# Patient Record
Sex: Male | Born: 1945
Health system: Southern US, Community
[De-identification: ages and names within clinical notes are randomized; demographics above are authoritative.]

---

## 2007-11-03 ENCOUNTER — Ambulatory Visit (HOSPITAL_COMMUNITY): Admission: RE | Admit: 2007-11-03 | Discharge: 2007-11-04 | Payer: Self-pay | Admitting: Otolaryngology

## 2009-09-27 IMAGING — CR DG CHEST 2V
2 series · 2 of 2 positions shown · non-contrast
Comparison: None

CLINICAL DATA: Preop retinal detachment

CHEST - 2 VIEW

[view not recorded (1 of 2)]
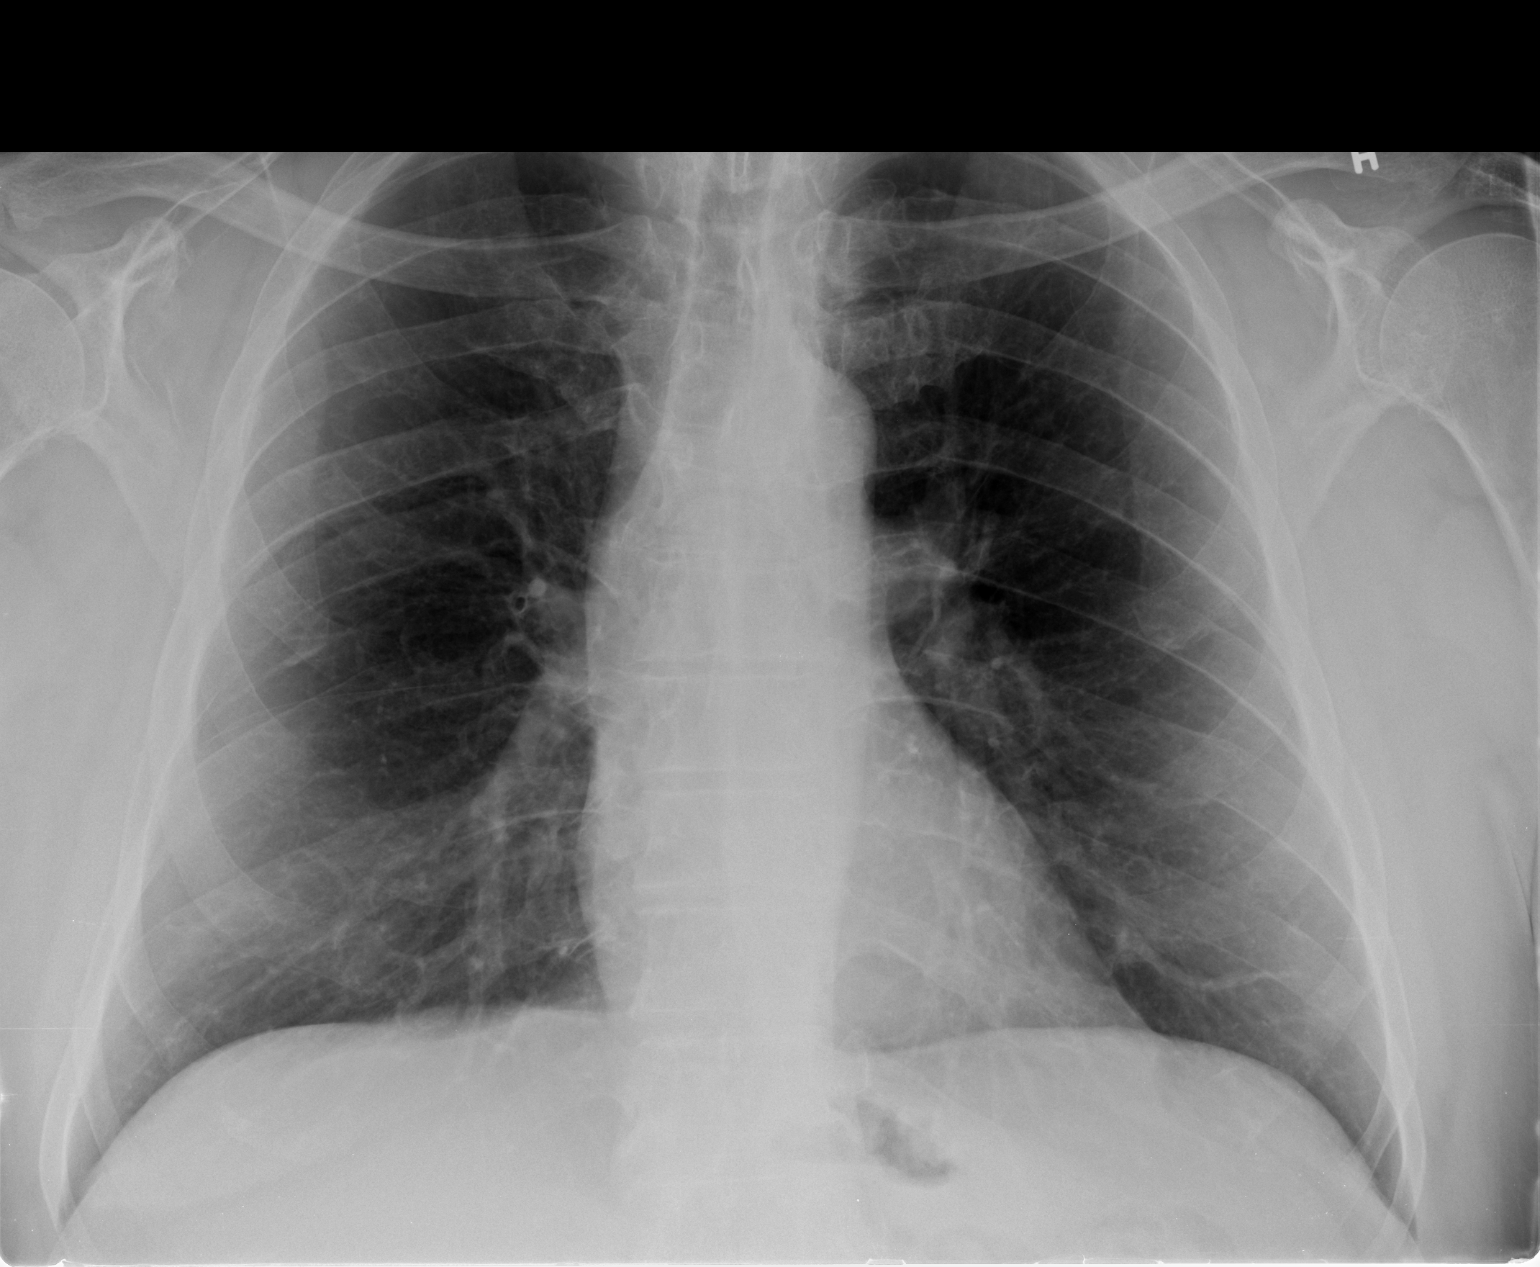

[view not recorded (2 of 2)]
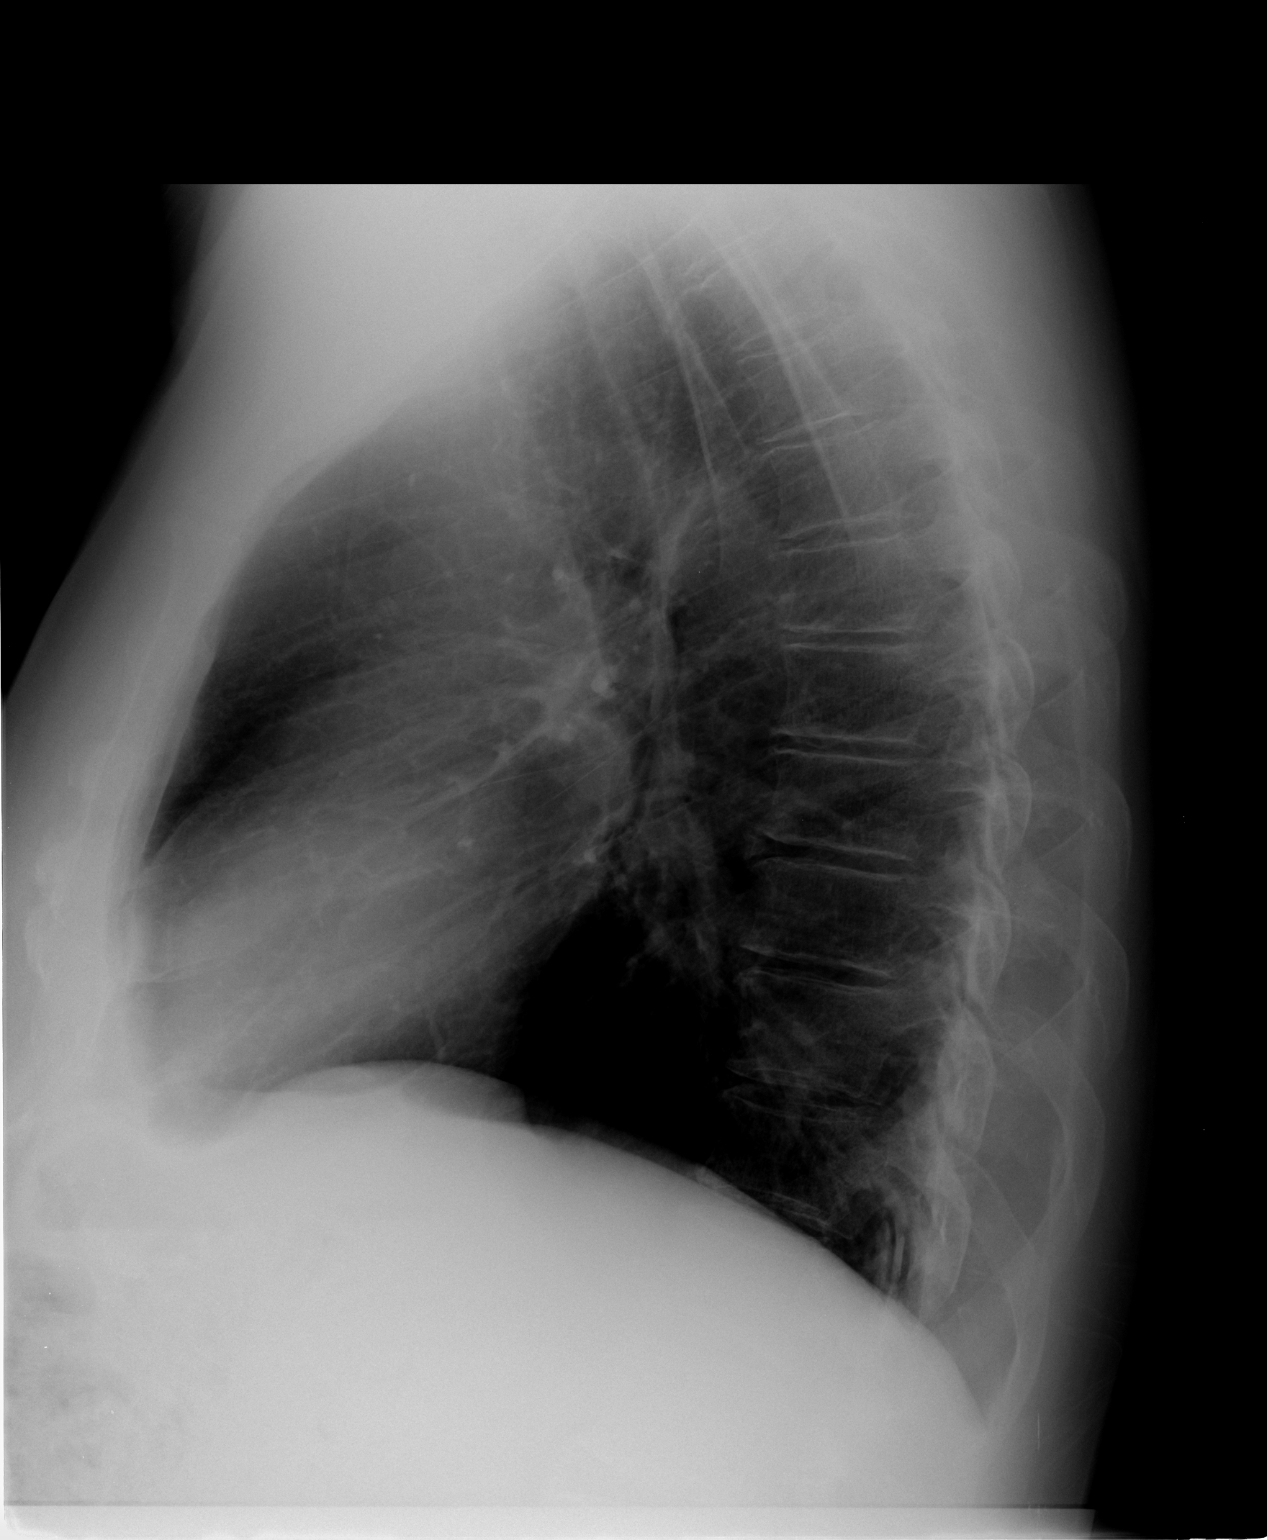

[2 of 2 positions shown; findings below may reference images not displayed]

FINDINGS: Normal mediastinum and cardiac silhouette.  Costophrenic
angles are clear.  No evidence effusion, infiltrate, or
pneumothorax.
IMPRESSION: No acute cardiopulmonary process.

## 2010-12-04 NOTE — Op Note (Signed)
NAMEROBB, SIBAL NO.:  [id number]   MEDICAL RECORD NO.:  [id number]          PATIENT TYPE:  OIB   LOCATION:  5125                         FACILITY:  [facility]   PHYSICIAN:  Beulah Gandy. Ashley Royalty, M.D. DATE OF BIRTH:  Feb 26, 1946   DATE OF PROCEDURE:  11/03/2007  DATE OF DISCHARGE:                               OPERATIVE REPORT   ADMISSION DIAGNOSES:  1. Retinal break with detachment, right eye.  2. Retinal break with a large retinal detachment, left eye.   PROCEDURES:  1. Retinal photocoagulation for retinal break, right eye.  2. Scleral buckle, retinal photocoagulation, gas fluid exchange in the      left eye.   SURGEON:  Beulah Gandy. Ashley Royalty, M.D.   ASSISTANT:  Rosalie Doctor, M.A.   ANESTHESIA:  General.   PROCEDURE IN DETAIL:  After proper endotracheal anesthesia, attention  was carried to the right eye where the indirect ophthalmoscope laser was  moved into place, 1089 burns were placed around the superior area of  retinal detachment and breaks.  The power was 400 mW and it was 0.1  seconds each and the spot size was 1000 microns.  Ointment was placed in  the eye and it was patched, shut.  Attention was then carried to the  left eye, where the usual prep and drape was performed.  A 360-degree  limbal peritomy isolation of 4 rectus muscles on 2-0 silk localization  of break at 2 o'clock.  Scleral dissection for 360 degrees to admit  number 279 intrascleral implant.  Diathermy placed in the bed.  An  additional radial thyroid G segment was placed at 2 o'clock beneath the  break.  Perforation site was chosen at 10 o'clock.  Two sutures per  quadrant for a total of eight 4-0 Mersilene sutures were placed in the  scleral flaps.  Perforation was performed without complication.  A large  amount of clear, colorless, thick, sticky fluid came forth.  The scleral  sutures were drawn securely as the fluid egressed.  Additional volume  was required and 50% C3F8 1.8 mL was  injected into the vitreous cavity  to reinflate the globe.  The buckle was adjusted and trimmed.  The band  was adjusted and trimmed.  The sutures were knotted and the free ends  removed.  Indirect ophthalmoscopy showed the retina to be lying nicely  in place with a break well supported.  The indirect ophthalmoscope laser  was moved into place, 529 burns were placed around the retinal break in  the area of the previous detachment.  The power was 1100 mW, 1000  microns each and 0.15 seconds each.  The conjunctiva was reapproximated  with 7-0 chromic suture.  Polymyxin and gentamicin were irrigated at the  [place] space.  Atropine solution was applied.  Marcaine was injected  around the globe for postop pain.  Decadron 10 mg was injected to the  lower subconjunctival space.  A paracentesis x1 obtained to closing  pressure of 15 with a bare keratometer.  TobraDex ophthalmic ointment, a  patch and shield were placed.  The patient was awaken and taken to  recovery in satisfactory condition.      Beulah Gandy. Ashley Royalty, M.D.  Electronically Signed     JDM/MEDQ  D:  11/03/2007  T:  11/04/2007  Job:  [id number]

## 2011-03-08 ENCOUNTER — Encounter (INDEPENDENT_AMBULATORY_CARE_PROVIDER_SITE_OTHER): Payer: Managed Care, Other (non HMO) | Admitting: Ophthalmology

## 2011-03-08 DIAGNOSIS — H33309 Unspecified retinal break, unspecified eye: Secondary | ICD-10-CM

## 2011-03-08 DIAGNOSIS — H43819 Vitreous degeneration, unspecified eye: Secondary | ICD-10-CM

## 2011-03-08 DIAGNOSIS — H33009 Unspecified retinal detachment with retinal break, unspecified eye: Secondary | ICD-10-CM

## 2011-03-08 DIAGNOSIS — E11319 Type 2 diabetes mellitus with unspecified diabetic retinopathy without macular edema: Secondary | ICD-10-CM

## 2011-04-16 LAB — BASIC METABOLIC PANEL
BUN: 15
CO2: 29
Calcium: 9.4
Creatinine, Ser: 0.98
GFR calc non Af Amer: >60
Glucose, Bld: 146 — ABNORMAL HIGH

## 2011-04-16 LAB — CBC
Hemoglobin: 16.2
MCHC: 34.1
Platelets: 256
RDW: 13.8

## 2012-03-04 ENCOUNTER — Encounter (INDEPENDENT_AMBULATORY_CARE_PROVIDER_SITE_OTHER): Payer: Medicare Other | Admitting: Ophthalmology

## 2012-03-04 DIAGNOSIS — H33309 Unspecified retinal break, unspecified eye: Secondary | ICD-10-CM

## 2012-03-04 DIAGNOSIS — E1139 Type 2 diabetes mellitus with other diabetic ophthalmic complication: Secondary | ICD-10-CM

## 2012-03-04 DIAGNOSIS — E11329 Type 2 diabetes mellitus with mild nonproliferative diabetic retinopathy without macular edema: Secondary | ICD-10-CM

## 2012-03-04 DIAGNOSIS — H26499 Other secondary cataract, unspecified eye: Secondary | ICD-10-CM

## 2012-03-04 DIAGNOSIS — H33009 Unspecified retinal detachment with retinal break, unspecified eye: Secondary | ICD-10-CM

## 2012-03-09 ENCOUNTER — Encounter (INDEPENDENT_AMBULATORY_CARE_PROVIDER_SITE_OTHER): Payer: Managed Care, Other (non HMO) | Admitting: Ophthalmology

## 2012-03-18 ENCOUNTER — Ambulatory Visit (INDEPENDENT_AMBULATORY_CARE_PROVIDER_SITE_OTHER): Payer: Medicare Other | Admitting: Ophthalmology

## 2012-03-18 DIAGNOSIS — H26499 Other secondary cataract, unspecified eye: Secondary | ICD-10-CM

## 2012-03-25 ENCOUNTER — Ambulatory Visit (INDEPENDENT_AMBULATORY_CARE_PROVIDER_SITE_OTHER): Payer: Medicare Other | Admitting: Ophthalmology

## 2012-03-25 DIAGNOSIS — H2700 Aphakia, unspecified eye: Secondary | ICD-10-CM

## 2013-03-29 ENCOUNTER — Ambulatory Visit (INDEPENDENT_AMBULATORY_CARE_PROVIDER_SITE_OTHER): Payer: Medicare Other | Admitting: Ophthalmology

## 2013-03-30 ENCOUNTER — Ambulatory Visit (INDEPENDENT_AMBULATORY_CARE_PROVIDER_SITE_OTHER): Payer: Medicare HMO | Admitting: Ophthalmology

## 2013-03-30 DIAGNOSIS — E11319 Type 2 diabetes mellitus with unspecified diabetic retinopathy without macular edema: Secondary | ICD-10-CM

## 2013-03-30 DIAGNOSIS — H33309 Unspecified retinal break, unspecified eye: Secondary | ICD-10-CM

## 2013-03-30 DIAGNOSIS — H43819 Vitreous degeneration, unspecified eye: Secondary | ICD-10-CM

## 2013-03-30 DIAGNOSIS — H35039 Hypertensive retinopathy, unspecified eye: Secondary | ICD-10-CM

## 2013-03-30 DIAGNOSIS — I10 Essential (primary) hypertension: Secondary | ICD-10-CM

## 2013-03-30 DIAGNOSIS — H33009 Unspecified retinal detachment with retinal break, unspecified eye: Secondary | ICD-10-CM

## 2013-03-30 DIAGNOSIS — E1139 Type 2 diabetes mellitus with other diabetic ophthalmic complication: Secondary | ICD-10-CM

## 2013-12-29 DIAGNOSIS — R351 Nocturia: Secondary | ICD-10-CM | POA: Insufficient documentation

## 2013-12-29 DIAGNOSIS — I10 Essential (primary) hypertension: Secondary | ICD-10-CM | POA: Insufficient documentation

## 2013-12-29 DIAGNOSIS — E119 Type 2 diabetes mellitus without complications: Secondary | ICD-10-CM | POA: Insufficient documentation

## 2014-03-30 ENCOUNTER — Ambulatory Visit (INDEPENDENT_AMBULATORY_CARE_PROVIDER_SITE_OTHER): Payer: Medicare HMO | Admitting: Ophthalmology

## 2014-04-01 ENCOUNTER — Ambulatory Visit (INDEPENDENT_AMBULATORY_CARE_PROVIDER_SITE_OTHER): Payer: Medicare HMO | Admitting: Ophthalmology

## 2014-04-01 ENCOUNTER — Ambulatory Visit (INDEPENDENT_AMBULATORY_CARE_PROVIDER_SITE_OTHER): Payer: Commercial Managed Care - HMO | Admitting: Ophthalmology

## 2014-04-01 DIAGNOSIS — H43819 Vitreous degeneration, unspecified eye: Secondary | ICD-10-CM

## 2014-04-01 DIAGNOSIS — H35039 Hypertensive retinopathy, unspecified eye: Secondary | ICD-10-CM

## 2014-04-01 DIAGNOSIS — H33309 Unspecified retinal break, unspecified eye: Secondary | ICD-10-CM

## 2014-04-01 DIAGNOSIS — E11319 Type 2 diabetes mellitus with unspecified diabetic retinopathy without macular edema: Secondary | ICD-10-CM

## 2014-04-01 DIAGNOSIS — H33009 Unspecified retinal detachment with retinal break, unspecified eye: Secondary | ICD-10-CM

## 2014-04-01 DIAGNOSIS — E1139 Type 2 diabetes mellitus with other diabetic ophthalmic complication: Secondary | ICD-10-CM

## 2014-04-01 DIAGNOSIS — I10 Essential (primary) hypertension: Secondary | ICD-10-CM

## 2014-04-01 DIAGNOSIS — E1165 Type 2 diabetes mellitus with hyperglycemia: Secondary | ICD-10-CM

## 2015-04-14 ENCOUNTER — Ambulatory Visit (INDEPENDENT_AMBULATORY_CARE_PROVIDER_SITE_OTHER): Payer: Commercial Managed Care - HMO | Admitting: Ophthalmology

## 2015-05-19 ENCOUNTER — Ambulatory Visit (INDEPENDENT_AMBULATORY_CARE_PROVIDER_SITE_OTHER): Payer: Commercial Managed Care - HMO | Admitting: Ophthalmology

## 2015-05-19 DIAGNOSIS — H33301 Unspecified retinal break, right eye: Secondary | ICD-10-CM

## 2015-05-19 DIAGNOSIS — I10 Essential (primary) hypertension: Secondary | ICD-10-CM | POA: Diagnosis not present

## 2015-05-19 DIAGNOSIS — H43813 Vitreous degeneration, bilateral: Secondary | ICD-10-CM

## 2015-05-19 DIAGNOSIS — E11319 Type 2 diabetes mellitus with unspecified diabetic retinopathy without macular edema: Secondary | ICD-10-CM | POA: Diagnosis not present

## 2015-05-19 DIAGNOSIS — H338 Other retinal detachments: Secondary | ICD-10-CM | POA: Diagnosis not present

## 2015-05-19 DIAGNOSIS — E113293 Type 2 diabetes mellitus with mild nonproliferative diabetic retinopathy without macular edema, bilateral: Secondary | ICD-10-CM | POA: Diagnosis not present

## 2015-05-19 DIAGNOSIS — H35033 Hypertensive retinopathy, bilateral: Secondary | ICD-10-CM

## 2015-09-06 DIAGNOSIS — E1159 Type 2 diabetes mellitus with other circulatory complications: Secondary | ICD-10-CM | POA: Diagnosis not present

## 2015-09-06 DIAGNOSIS — E1165 Type 2 diabetes mellitus with hyperglycemia: Secondary | ICD-10-CM | POA: Diagnosis not present

## 2015-09-06 DIAGNOSIS — E1151 Type 2 diabetes mellitus with diabetic peripheral angiopathy without gangrene: Secondary | ICD-10-CM | POA: Diagnosis not present

## 2015-09-06 DIAGNOSIS — E782 Mixed hyperlipidemia: Secondary | ICD-10-CM | POA: Diagnosis not present

## 2015-09-15 DIAGNOSIS — E1151 Type 2 diabetes mellitus with diabetic peripheral angiopathy without gangrene: Secondary | ICD-10-CM | POA: Diagnosis not present

## 2015-09-15 DIAGNOSIS — E1159 Type 2 diabetes mellitus with other circulatory complications: Secondary | ICD-10-CM | POA: Diagnosis not present

## 2015-09-15 DIAGNOSIS — I10 Essential (primary) hypertension: Secondary | ICD-10-CM | POA: Diagnosis not present

## 2015-09-15 DIAGNOSIS — E1165 Type 2 diabetes mellitus with hyperglycemia: Secondary | ICD-10-CM | POA: Diagnosis not present

## 2015-09-22 DIAGNOSIS — Z1389 Encounter for screening for other disorder: Secondary | ICD-10-CM | POA: Diagnosis not present

## 2015-09-22 DIAGNOSIS — Z139 Encounter for screening, unspecified: Secondary | ICD-10-CM | POA: Diagnosis not present

## 2015-09-22 DIAGNOSIS — Z0000 Encounter for general adult medical examination without abnormal findings: Secondary | ICD-10-CM | POA: Diagnosis not present

## 2015-12-20 DIAGNOSIS — E782 Mixed hyperlipidemia: Secondary | ICD-10-CM | POA: Diagnosis not present

## 2015-12-20 DIAGNOSIS — E1151 Type 2 diabetes mellitus with diabetic peripheral angiopathy without gangrene: Secondary | ICD-10-CM | POA: Diagnosis not present

## 2015-12-20 DIAGNOSIS — E1159 Type 2 diabetes mellitus with other circulatory complications: Secondary | ICD-10-CM | POA: Diagnosis not present

## 2015-12-20 DIAGNOSIS — Z125 Encounter for screening for malignant neoplasm of prostate: Secondary | ICD-10-CM | POA: Diagnosis not present

## 2015-12-20 DIAGNOSIS — E1165 Type 2 diabetes mellitus with hyperglycemia: Secondary | ICD-10-CM | POA: Diagnosis not present

## 2015-12-29 DIAGNOSIS — E1151 Type 2 diabetes mellitus with diabetic peripheral angiopathy without gangrene: Secondary | ICD-10-CM | POA: Diagnosis not present

## 2015-12-29 DIAGNOSIS — E1165 Type 2 diabetes mellitus with hyperglycemia: Secondary | ICD-10-CM | POA: Diagnosis not present

## 2015-12-29 DIAGNOSIS — E1159 Type 2 diabetes mellitus with other circulatory complications: Secondary | ICD-10-CM | POA: Diagnosis not present

## 2015-12-29 DIAGNOSIS — I10 Essential (primary) hypertension: Secondary | ICD-10-CM | POA: Diagnosis not present

## 2016-03-22 DIAGNOSIS — Z9842 Cataract extraction status, left eye: Secondary | ICD-10-CM | POA: Diagnosis not present

## 2016-03-22 DIAGNOSIS — Z961 Presence of intraocular lens: Secondary | ICD-10-CM | POA: Diagnosis not present

## 2016-03-22 DIAGNOSIS — Z9841 Cataract extraction status, right eye: Secondary | ICD-10-CM | POA: Diagnosis not present

## 2016-05-01 DIAGNOSIS — E1159 Type 2 diabetes mellitus with other circulatory complications: Secondary | ICD-10-CM | POA: Diagnosis not present

## 2016-05-01 DIAGNOSIS — E782 Mixed hyperlipidemia: Secondary | ICD-10-CM | POA: Diagnosis not present

## 2016-05-01 DIAGNOSIS — E1151 Type 2 diabetes mellitus with diabetic peripheral angiopathy without gangrene: Secondary | ICD-10-CM | POA: Diagnosis not present

## 2016-05-01 DIAGNOSIS — E1165 Type 2 diabetes mellitus with hyperglycemia: Secondary | ICD-10-CM | POA: Diagnosis not present

## 2016-05-10 DIAGNOSIS — E1159 Type 2 diabetes mellitus with other circulatory complications: Secondary | ICD-10-CM | POA: Diagnosis not present

## 2016-05-10 DIAGNOSIS — E1165 Type 2 diabetes mellitus with hyperglycemia: Secondary | ICD-10-CM | POA: Diagnosis not present

## 2016-05-10 DIAGNOSIS — E1151 Type 2 diabetes mellitus with diabetic peripheral angiopathy without gangrene: Secondary | ICD-10-CM | POA: Diagnosis not present

## 2016-05-10 DIAGNOSIS — I10 Essential (primary) hypertension: Secondary | ICD-10-CM | POA: Diagnosis not present

## 2016-05-24 ENCOUNTER — Ambulatory Visit (INDEPENDENT_AMBULATORY_CARE_PROVIDER_SITE_OTHER): Payer: Medicare Other | Admitting: Ophthalmology

## 2016-05-24 DIAGNOSIS — E11319 Type 2 diabetes mellitus with unspecified diabetic retinopathy without macular edema: Secondary | ICD-10-CM

## 2016-05-24 DIAGNOSIS — H33301 Unspecified retinal break, right eye: Secondary | ICD-10-CM | POA: Diagnosis not present

## 2016-05-24 DIAGNOSIS — H35033 Hypertensive retinopathy, bilateral: Secondary | ICD-10-CM | POA: Diagnosis not present

## 2016-05-24 DIAGNOSIS — H43813 Vitreous degeneration, bilateral: Secondary | ICD-10-CM

## 2016-05-24 DIAGNOSIS — E113293 Type 2 diabetes mellitus with mild nonproliferative diabetic retinopathy without macular edema, bilateral: Secondary | ICD-10-CM | POA: Diagnosis not present

## 2016-05-24 DIAGNOSIS — H338 Other retinal detachments: Secondary | ICD-10-CM

## 2016-05-24 DIAGNOSIS — I10 Essential (primary) hypertension: Secondary | ICD-10-CM | POA: Diagnosis not present

## 2016-09-16 DIAGNOSIS — E1151 Type 2 diabetes mellitus with diabetic peripheral angiopathy without gangrene: Secondary | ICD-10-CM | POA: Diagnosis not present

## 2016-09-16 DIAGNOSIS — E782 Mixed hyperlipidemia: Secondary | ICD-10-CM | POA: Diagnosis not present

## 2016-09-16 DIAGNOSIS — E1159 Type 2 diabetes mellitus with other circulatory complications: Secondary | ICD-10-CM | POA: Diagnosis not present

## 2016-09-27 DIAGNOSIS — I10 Essential (primary) hypertension: Secondary | ICD-10-CM | POA: Diagnosis not present

## 2016-09-27 DIAGNOSIS — E1151 Type 2 diabetes mellitus with diabetic peripheral angiopathy without gangrene: Secondary | ICD-10-CM | POA: Diagnosis not present

## 2016-09-27 DIAGNOSIS — I499 Cardiac arrhythmia, unspecified: Secondary | ICD-10-CM | POA: Diagnosis not present

## 2016-09-27 DIAGNOSIS — E1159 Type 2 diabetes mellitus with other circulatory complications: Secondary | ICD-10-CM | POA: Diagnosis not present

## 2016-09-27 DIAGNOSIS — E1165 Type 2 diabetes mellitus with hyperglycemia: Secondary | ICD-10-CM | POA: Diagnosis not present

## 2016-11-22 DIAGNOSIS — Z0000 Encounter for general adult medical examination without abnormal findings: Secondary | ICD-10-CM | POA: Diagnosis not present

## 2016-11-22 DIAGNOSIS — Z1389 Encounter for screening for other disorder: Secondary | ICD-10-CM | POA: Diagnosis not present

## 2016-11-22 DIAGNOSIS — Z139 Encounter for screening, unspecified: Secondary | ICD-10-CM | POA: Diagnosis not present

## 2016-11-22 DIAGNOSIS — Z1211 Encounter for screening for malignant neoplasm of colon: Secondary | ICD-10-CM | POA: Diagnosis not present

## 2017-01-31 DIAGNOSIS — E1151 Type 2 diabetes mellitus with diabetic peripheral angiopathy without gangrene: Secondary | ICD-10-CM | POA: Diagnosis not present

## 2017-01-31 DIAGNOSIS — E291 Testicular hypofunction: Secondary | ICD-10-CM | POA: Diagnosis not present

## 2017-01-31 DIAGNOSIS — E1159 Type 2 diabetes mellitus with other circulatory complications: Secondary | ICD-10-CM | POA: Diagnosis not present

## 2017-01-31 DIAGNOSIS — E1169 Type 2 diabetes mellitus with other specified complication: Secondary | ICD-10-CM | POA: Diagnosis not present

## 2017-02-07 DIAGNOSIS — Z139 Encounter for screening, unspecified: Secondary | ICD-10-CM | POA: Diagnosis not present

## 2017-02-07 DIAGNOSIS — E1159 Type 2 diabetes mellitus with other circulatory complications: Secondary | ICD-10-CM | POA: Diagnosis not present

## 2017-02-07 DIAGNOSIS — I10 Essential (primary) hypertension: Secondary | ICD-10-CM | POA: Diagnosis not present

## 2017-02-07 DIAGNOSIS — E1165 Type 2 diabetes mellitus with hyperglycemia: Secondary | ICD-10-CM | POA: Diagnosis not present

## 2017-02-07 DIAGNOSIS — Z1389 Encounter for screening for other disorder: Secondary | ICD-10-CM | POA: Diagnosis not present

## 2017-02-07 DIAGNOSIS — E1151 Type 2 diabetes mellitus with diabetic peripheral angiopathy without gangrene: Secondary | ICD-10-CM | POA: Diagnosis not present

## 2017-04-01 DIAGNOSIS — E291 Testicular hypofunction: Secondary | ICD-10-CM | POA: Insufficient documentation

## 2017-04-18 DIAGNOSIS — E291 Testicular hypofunction: Secondary | ICD-10-CM | POA: Diagnosis not present

## 2017-05-30 ENCOUNTER — Ambulatory Visit (INDEPENDENT_AMBULATORY_CARE_PROVIDER_SITE_OTHER): Payer: Medicare Other | Admitting: Ophthalmology

## 2017-05-30 DIAGNOSIS — E1169 Type 2 diabetes mellitus with other specified complication: Secondary | ICD-10-CM | POA: Diagnosis not present

## 2017-05-30 DIAGNOSIS — E1159 Type 2 diabetes mellitus with other circulatory complications: Secondary | ICD-10-CM | POA: Diagnosis not present

## 2017-05-30 DIAGNOSIS — E1151 Type 2 diabetes mellitus with diabetic peripheral angiopathy without gangrene: Secondary | ICD-10-CM | POA: Diagnosis not present

## 2017-06-09 DIAGNOSIS — E1159 Type 2 diabetes mellitus with other circulatory complications: Secondary | ICD-10-CM | POA: Diagnosis not present

## 2017-06-09 DIAGNOSIS — I10 Essential (primary) hypertension: Secondary | ICD-10-CM | POA: Diagnosis not present

## 2017-06-09 DIAGNOSIS — E1151 Type 2 diabetes mellitus with diabetic peripheral angiopathy without gangrene: Secondary | ICD-10-CM | POA: Diagnosis not present

## 2017-06-09 DIAGNOSIS — E1165 Type 2 diabetes mellitus with hyperglycemia: Secondary | ICD-10-CM | POA: Diagnosis not present

## 2017-06-20 ENCOUNTER — Encounter (INDEPENDENT_AMBULATORY_CARE_PROVIDER_SITE_OTHER): Payer: Medicare Other | Admitting: Ophthalmology

## 2017-06-24 DIAGNOSIS — E1151 Type 2 diabetes mellitus with diabetic peripheral angiopathy without gangrene: Secondary | ICD-10-CM | POA: Diagnosis not present

## 2017-06-24 DIAGNOSIS — E1165 Type 2 diabetes mellitus with hyperglycemia: Secondary | ICD-10-CM | POA: Diagnosis not present

## 2017-07-04 ENCOUNTER — Encounter (INDEPENDENT_AMBULATORY_CARE_PROVIDER_SITE_OTHER): Payer: Medicare Other | Admitting: Ophthalmology

## 2017-07-04 DIAGNOSIS — I10 Essential (primary) hypertension: Secondary | ICD-10-CM

## 2017-07-04 DIAGNOSIS — E11319 Type 2 diabetes mellitus with unspecified diabetic retinopathy without macular edema: Secondary | ICD-10-CM

## 2017-07-04 DIAGNOSIS — H43813 Vitreous degeneration, bilateral: Secondary | ICD-10-CM | POA: Diagnosis not present

## 2017-07-04 DIAGNOSIS — H33301 Unspecified retinal break, right eye: Secondary | ICD-10-CM | POA: Diagnosis not present

## 2017-07-04 DIAGNOSIS — H338 Other retinal detachments: Secondary | ICD-10-CM | POA: Diagnosis not present

## 2017-07-04 DIAGNOSIS — E113293 Type 2 diabetes mellitus with mild nonproliferative diabetic retinopathy without macular edema, bilateral: Secondary | ICD-10-CM | POA: Diagnosis not present

## 2017-07-04 DIAGNOSIS — H35033 Hypertensive retinopathy, bilateral: Secondary | ICD-10-CM | POA: Diagnosis not present

## 2017-07-11 DIAGNOSIS — E291 Testicular hypofunction: Secondary | ICD-10-CM | POA: Diagnosis not present

## 2017-07-11 DIAGNOSIS — Z125 Encounter for screening for malignant neoplasm of prostate: Secondary | ICD-10-CM | POA: Diagnosis not present

## 2017-07-18 DIAGNOSIS — E291 Testicular hypofunction: Secondary | ICD-10-CM | POA: Diagnosis not present

## 2017-08-01 ENCOUNTER — Encounter (INDEPENDENT_AMBULATORY_CARE_PROVIDER_SITE_OTHER): Payer: Medicare Other | Admitting: Ophthalmology

## 2017-08-01 DIAGNOSIS — H33301 Unspecified retinal break, right eye: Secondary | ICD-10-CM

## 2017-08-04 DIAGNOSIS — E1165 Type 2 diabetes mellitus with hyperglycemia: Secondary | ICD-10-CM | POA: Diagnosis not present

## 2017-08-04 DIAGNOSIS — E1151 Type 2 diabetes mellitus with diabetic peripheral angiopathy without gangrene: Secondary | ICD-10-CM | POA: Diagnosis not present

## 2017-08-15 DIAGNOSIS — S39012A Strain of muscle, fascia and tendon of lower back, initial encounter: Secondary | ICD-10-CM | POA: Diagnosis not present

## 2017-09-01 DIAGNOSIS — E1151 Type 2 diabetes mellitus with diabetic peripheral angiopathy without gangrene: Secondary | ICD-10-CM | POA: Diagnosis not present

## 2017-09-01 DIAGNOSIS — E1165 Type 2 diabetes mellitus with hyperglycemia: Secondary | ICD-10-CM | POA: Diagnosis not present

## 2017-09-15 DIAGNOSIS — M48061 Spinal stenosis, lumbar region without neurogenic claudication: Secondary | ICD-10-CM | POA: Diagnosis not present

## 2017-09-15 DIAGNOSIS — M47816 Spondylosis without myelopathy or radiculopathy, lumbar region: Secondary | ICD-10-CM | POA: Diagnosis not present

## 2017-09-15 DIAGNOSIS — M549 Dorsalgia, unspecified: Secondary | ICD-10-CM | POA: Diagnosis not present

## 2017-09-29 DIAGNOSIS — M47816 Spondylosis without myelopathy or radiculopathy, lumbar region: Secondary | ICD-10-CM | POA: Diagnosis not present

## 2017-09-30 DIAGNOSIS — E1151 Type 2 diabetes mellitus with diabetic peripheral angiopathy without gangrene: Secondary | ICD-10-CM | POA: Diagnosis not present

## 2017-09-30 DIAGNOSIS — E1165 Type 2 diabetes mellitus with hyperglycemia: Secondary | ICD-10-CM | POA: Diagnosis not present

## 2017-10-03 DIAGNOSIS — E291 Testicular hypofunction: Secondary | ICD-10-CM | POA: Diagnosis not present

## 2017-10-03 DIAGNOSIS — E1159 Type 2 diabetes mellitus with other circulatory complications: Secondary | ICD-10-CM | POA: Diagnosis not present

## 2017-10-03 DIAGNOSIS — E1169 Type 2 diabetes mellitus with other specified complication: Secondary | ICD-10-CM | POA: Diagnosis not present

## 2017-10-03 DIAGNOSIS — E1151 Type 2 diabetes mellitus with diabetic peripheral angiopathy without gangrene: Secondary | ICD-10-CM | POA: Diagnosis not present

## 2017-10-08 ENCOUNTER — Encounter: Payer: Self-pay | Admitting: Gastroenterology

## 2017-10-08 DIAGNOSIS — E291 Testicular hypofunction: Secondary | ICD-10-CM | POA: Diagnosis not present

## 2017-10-10 DIAGNOSIS — Z139 Encounter for screening, unspecified: Secondary | ICD-10-CM | POA: Diagnosis not present

## 2017-10-10 DIAGNOSIS — Z9181 History of falling: Secondary | ICD-10-CM | POA: Diagnosis not present

## 2017-10-10 DIAGNOSIS — Z1331 Encounter for screening for depression: Secondary | ICD-10-CM | POA: Diagnosis not present

## 2017-10-10 DIAGNOSIS — Z0000 Encounter for general adult medical examination without abnormal findings: Secondary | ICD-10-CM | POA: Diagnosis not present

## 2017-10-10 DIAGNOSIS — E1165 Type 2 diabetes mellitus with hyperglycemia: Secondary | ICD-10-CM | POA: Diagnosis not present

## 2017-10-10 DIAGNOSIS — E1159 Type 2 diabetes mellitus with other circulatory complications: Secondary | ICD-10-CM | POA: Diagnosis not present

## 2017-10-10 DIAGNOSIS — I10 Essential (primary) hypertension: Secondary | ICD-10-CM | POA: Diagnosis not present

## 2017-10-10 DIAGNOSIS — E1151 Type 2 diabetes mellitus with diabetic peripheral angiopathy without gangrene: Secondary | ICD-10-CM | POA: Diagnosis not present

## 2017-10-22 DIAGNOSIS — E291 Testicular hypofunction: Secondary | ICD-10-CM | POA: Diagnosis not present

## 2017-11-14 DIAGNOSIS — E1159 Type 2 diabetes mellitus with other circulatory complications: Secondary | ICD-10-CM | POA: Diagnosis not present

## 2017-11-21 DIAGNOSIS — E1165 Type 2 diabetes mellitus with hyperglycemia: Secondary | ICD-10-CM | POA: Diagnosis not present

## 2017-11-21 DIAGNOSIS — E1129 Type 2 diabetes mellitus with other diabetic kidney complication: Secondary | ICD-10-CM | POA: Diagnosis not present

## 2017-11-21 DIAGNOSIS — N183 Chronic kidney disease, stage 3 (moderate): Secondary | ICD-10-CM | POA: Diagnosis not present

## 2017-11-28 ENCOUNTER — Encounter (INDEPENDENT_AMBULATORY_CARE_PROVIDER_SITE_OTHER): Payer: Medicare Other | Admitting: Ophthalmology

## 2017-11-28 DIAGNOSIS — E113293 Type 2 diabetes mellitus with mild nonproliferative diabetic retinopathy without macular edema, bilateral: Secondary | ICD-10-CM | POA: Diagnosis not present

## 2017-11-28 DIAGNOSIS — H338 Other retinal detachments: Secondary | ICD-10-CM

## 2017-11-28 DIAGNOSIS — E11319 Type 2 diabetes mellitus with unspecified diabetic retinopathy without macular edema: Secondary | ICD-10-CM | POA: Diagnosis not present

## 2017-11-28 DIAGNOSIS — H35033 Hypertensive retinopathy, bilateral: Secondary | ICD-10-CM

## 2017-11-28 DIAGNOSIS — I10 Essential (primary) hypertension: Secondary | ICD-10-CM | POA: Diagnosis not present

## 2017-11-28 DIAGNOSIS — H43813 Vitreous degeneration, bilateral: Secondary | ICD-10-CM

## 2017-11-28 DIAGNOSIS — H33301 Unspecified retinal break, right eye: Secondary | ICD-10-CM

## 2018-01-12 DIAGNOSIS — E1169 Type 2 diabetes mellitus with other specified complication: Secondary | ICD-10-CM | POA: Diagnosis not present

## 2018-01-12 DIAGNOSIS — E1159 Type 2 diabetes mellitus with other circulatory complications: Secondary | ICD-10-CM | POA: Diagnosis not present

## 2018-01-12 DIAGNOSIS — E1151 Type 2 diabetes mellitus with diabetic peripheral angiopathy without gangrene: Secondary | ICD-10-CM | POA: Diagnosis not present

## 2018-01-16 DIAGNOSIS — E1151 Type 2 diabetes mellitus with diabetic peripheral angiopathy without gangrene: Secondary | ICD-10-CM | POA: Diagnosis not present

## 2018-01-16 DIAGNOSIS — I10 Essential (primary) hypertension: Secondary | ICD-10-CM | POA: Diagnosis not present

## 2018-01-16 DIAGNOSIS — E1165 Type 2 diabetes mellitus with hyperglycemia: Secondary | ICD-10-CM | POA: Diagnosis not present

## 2018-01-16 DIAGNOSIS — N182 Chronic kidney disease, stage 2 (mild): Secondary | ICD-10-CM | POA: Diagnosis not present

## 2018-03-20 DIAGNOSIS — S42111A Displaced fracture of body of scapula, right shoulder, initial encounter for closed fracture: Secondary | ICD-10-CM | POA: Diagnosis not present

## 2018-03-20 DIAGNOSIS — S42201A Unspecified fracture of upper end of right humerus, initial encounter for closed fracture: Secondary | ICD-10-CM | POA: Diagnosis not present

## 2018-03-20 DIAGNOSIS — S42101A Fracture of unspecified part of scapula, right shoulder, initial encounter for closed fracture: Secondary | ICD-10-CM | POA: Diagnosis not present

## 2018-03-26 DIAGNOSIS — S42141A Displaced fracture of glenoid cavity of scapula, right shoulder, initial encounter for closed fracture: Secondary | ICD-10-CM | POA: Diagnosis not present

## 2018-03-30 DIAGNOSIS — S42141A Displaced fracture of glenoid cavity of scapula, right shoulder, initial encounter for closed fracture: Secondary | ICD-10-CM | POA: Diagnosis not present

## 2018-04-03 DIAGNOSIS — M25611 Stiffness of right shoulder, not elsewhere classified: Secondary | ICD-10-CM | POA: Diagnosis not present

## 2018-04-03 DIAGNOSIS — M25511 Pain in right shoulder: Secondary | ICD-10-CM | POA: Diagnosis not present

## 2018-04-08 DIAGNOSIS — M25611 Stiffness of right shoulder, not elsewhere classified: Secondary | ICD-10-CM | POA: Diagnosis not present

## 2018-04-08 DIAGNOSIS — M25511 Pain in right shoulder: Secondary | ICD-10-CM | POA: Diagnosis not present

## 2018-04-13 DIAGNOSIS — S42141D Displaced fracture of glenoid cavity of scapula, right shoulder, subsequent encounter for fracture with routine healing: Secondary | ICD-10-CM | POA: Diagnosis not present

## 2018-04-15 DIAGNOSIS — M25511 Pain in right shoulder: Secondary | ICD-10-CM | POA: Diagnosis not present

## 2018-04-15 DIAGNOSIS — M25611 Stiffness of right shoulder, not elsewhere classified: Secondary | ICD-10-CM | POA: Diagnosis not present

## 2018-04-22 DIAGNOSIS — M25611 Stiffness of right shoulder, not elsewhere classified: Secondary | ICD-10-CM | POA: Diagnosis not present

## 2018-04-22 DIAGNOSIS — M25511 Pain in right shoulder: Secondary | ICD-10-CM | POA: Diagnosis not present

## 2018-05-13 DIAGNOSIS — S42141D Displaced fracture of glenoid cavity of scapula, right shoulder, subsequent encounter for fracture with routine healing: Secondary | ICD-10-CM | POA: Diagnosis not present

## 2018-05-15 DIAGNOSIS — E1169 Type 2 diabetes mellitus with other specified complication: Secondary | ICD-10-CM | POA: Diagnosis not present

## 2018-05-15 DIAGNOSIS — E1159 Type 2 diabetes mellitus with other circulatory complications: Secondary | ICD-10-CM | POA: Diagnosis not present

## 2018-05-15 DIAGNOSIS — E1151 Type 2 diabetes mellitus with diabetic peripheral angiopathy without gangrene: Secondary | ICD-10-CM | POA: Diagnosis not present

## 2018-05-22 DIAGNOSIS — Z7189 Other specified counseling: Secondary | ICD-10-CM | POA: Diagnosis not present

## 2018-05-22 DIAGNOSIS — E1165 Type 2 diabetes mellitus with hyperglycemia: Secondary | ICD-10-CM | POA: Diagnosis not present

## 2018-05-22 DIAGNOSIS — E1159 Type 2 diabetes mellitus with other circulatory complications: Secondary | ICD-10-CM | POA: Diagnosis not present

## 2018-05-22 DIAGNOSIS — E1151 Type 2 diabetes mellitus with diabetic peripheral angiopathy without gangrene: Secondary | ICD-10-CM | POA: Diagnosis not present

## 2018-05-22 DIAGNOSIS — I10 Essential (primary) hypertension: Secondary | ICD-10-CM | POA: Diagnosis not present

## 2018-09-24 DIAGNOSIS — E1151 Type 2 diabetes mellitus with diabetic peripheral angiopathy without gangrene: Secondary | ICD-10-CM | POA: Diagnosis not present

## 2018-09-24 DIAGNOSIS — E1159 Type 2 diabetes mellitus with other circulatory complications: Secondary | ICD-10-CM | POA: Diagnosis not present

## 2018-09-24 DIAGNOSIS — E1169 Type 2 diabetes mellitus with other specified complication: Secondary | ICD-10-CM | POA: Diagnosis not present

## 2018-10-02 DIAGNOSIS — E1151 Type 2 diabetes mellitus with diabetic peripheral angiopathy without gangrene: Secondary | ICD-10-CM | POA: Diagnosis not present

## 2018-10-02 DIAGNOSIS — I10 Essential (primary) hypertension: Secondary | ICD-10-CM | POA: Diagnosis not present

## 2018-10-02 DIAGNOSIS — E1169 Type 2 diabetes mellitus with other specified complication: Secondary | ICD-10-CM | POA: Diagnosis not present

## 2018-10-02 DIAGNOSIS — E1159 Type 2 diabetes mellitus with other circulatory complications: Secondary | ICD-10-CM | POA: Diagnosis not present

## 2018-12-04 ENCOUNTER — Encounter (INDEPENDENT_AMBULATORY_CARE_PROVIDER_SITE_OTHER): Payer: Medicare Other | Admitting: Ophthalmology

## 2019-01-15 ENCOUNTER — Other Ambulatory Visit: Payer: Self-pay

## 2019-01-15 ENCOUNTER — Encounter (INDEPENDENT_AMBULATORY_CARE_PROVIDER_SITE_OTHER): Payer: Medicare Other | Admitting: Ophthalmology

## 2019-01-15 DIAGNOSIS — H35033 Hypertensive retinopathy, bilateral: Secondary | ICD-10-CM | POA: Diagnosis not present

## 2019-01-15 DIAGNOSIS — H43813 Vitreous degeneration, bilateral: Secondary | ICD-10-CM

## 2019-01-15 DIAGNOSIS — E113293 Type 2 diabetes mellitus with mild nonproliferative diabetic retinopathy without macular edema, bilateral: Secondary | ICD-10-CM

## 2019-01-15 DIAGNOSIS — H33301 Unspecified retinal break, right eye: Secondary | ICD-10-CM

## 2019-01-15 DIAGNOSIS — H338 Other retinal detachments: Secondary | ICD-10-CM

## 2019-01-15 DIAGNOSIS — E11319 Type 2 diabetes mellitus with unspecified diabetic retinopathy without macular edema: Secondary | ICD-10-CM | POA: Diagnosis not present

## 2019-01-15 DIAGNOSIS — I10 Essential (primary) hypertension: Secondary | ICD-10-CM | POA: Diagnosis not present

## 2019-01-27 DIAGNOSIS — E1169 Type 2 diabetes mellitus with other specified complication: Secondary | ICD-10-CM | POA: Diagnosis not present

## 2019-01-27 DIAGNOSIS — E1151 Type 2 diabetes mellitus with diabetic peripheral angiopathy without gangrene: Secondary | ICD-10-CM | POA: Diagnosis not present

## 2019-01-27 DIAGNOSIS — E1159 Type 2 diabetes mellitus with other circulatory complications: Secondary | ICD-10-CM | POA: Diagnosis not present

## 2019-04-07 ENCOUNTER — Other Ambulatory Visit: Payer: Self-pay

## 2019-04-07 NOTE — Patient Outreach (Addendum)
[place] [facility]) Care Management  04/07/2019  Jose Grant November 06, 1945 [id number]  TELEPHONE SCREENING Referral date: 04/06/19 Referral source: primary MD  Referral reason: Medication assistance for trulicity Insurance: United health care  Outreach attempt # 1  Telephone call to patient regarding primary MD referral.  Unable to reach patient. Contact states patient works part time and is not at home currently. HIPAA compliant message left with call back phone number.    Social:  Conditions:  Medications:  Appointments:   Advance Directives:  Consent:   PLAN:  RNCM will attempt 2nd telephone call to patient within 4 business days. RNCM will send patient outreach letter to attempt contact.     Quinn Plowman RN,BSN,CCM [facility] Telephonic  [phone]

## 2019-04-07 NOTE — Patient Outreach (Signed)
[place] [facility]) Care Management  04/07/2019  MASOUD NYCE 16-Sep-1945 [id number]   TELEPHONE SCREENING Referral date: 04/06/19 Referral source: primary MD  Referral reason: Medication assistance for trulicity Insurance: [place] health care  Incoming call from patient.  HIPAA verified.  RNCM explained reason for call.  Patient states he needs medication assistance for his Trulicity.  He reports the medication is costing approximately $400 per month which he states he is unable to afford. Patient states he is connected with the [place] but states this medication is in a higher tier bracket and is therefore not covered. RNCM discussed and offered [facility] care management services. Patient verbally agreed to pharmacy assistance.  Patient states he has been a diabetic for a few years. He reports his most recent A1c is 7.0.  Patient states his A1c 4 months prior was 6.9.  Patient states he contributes the increase in his A1c to be from his inability to go to the gym.  Patient states he was consistently going to the gym several times per week , eating healthier along with the trulicity  which he feels was lowering his A1c.  Patient states he knows what to do as far as managing his diabetes.  He states his gym has reopened and he will now be able to resume exercise. Patient states he checks his blood sugars approximately 1 to 3 times per week.  He states his blood sugars range from 105 to 120's fasting. RNCM offered to call patient for follow up regarding his diabetes. Patient declined. Patient states he only needs cost assistance with his medication at this time.   RNCM advised patient to notify MD of any changes in condition prior to scheduled appointment. RNCM provided contact name and number: [phone] or main office number [phone] and 24 hour nurse advise line [phone] by mail.  RNCM verified patient aware of 911 services for urgent/ emergent needs.  PLAN;  RNCM will refer  patient to [facility] pharmacist.   Quinn Plowman RN,BSN,CCM [facility] Telephonic  [phone]

## 2019-04-08 ENCOUNTER — Other Ambulatory Visit: Payer: Self-pay | Admitting: Pharmacy Technician

## 2019-04-08 ENCOUNTER — Telehealth: Payer: Self-pay | Admitting: Pharmacist

## 2019-04-08 NOTE — Patient Outreach (Signed)
[place] [facility]) Care Management  04/08/2019  Jose Grant 11/17/1945 [id number]                                        Medication Assistance Referral  Referral From: [place]  Medication/Company: Danelle Berry / [name] Patient application portion:  Mailed Provider application portion: Faxed  to Dr Rhona Leavens Provider address/fax verified via: Office website    Follow up:  Will follow up with patient in 5-10 business days to confirm application(s) have been received.  Jose Wernick P. Jose Grant, [place] Management [phone]

## 2019-04-08 NOTE — Patient Outreach (Signed)
[place] [facility]) Care Management  [place]   04/08/2019  DAMICHAEL HOFMAN 1945/10/16 [id number]  Reason for referral: Medication Assistance  Referral source: MD (Dr. Nyra Capes) Referral medication(s): Trulicity Current insurance: United Health Care  HPI:  Patient is a 73 year old male with type 2 diabetes, hypertension, nocturia and male hypogonadism.  HIPAA identifiers were obtained.  Patient reported he is currently in the donut hole and needs help with Trulicity as the copay is cost prohibitive for him.   Objective: No Known Allergies  Medications Reviewed Today    Reviewed by [name], [facility] (Pharmacist) on 04/08/19 at [place] List Status: <None>  Medication Order Taking? Sig Documenting Provider Last Dose Status Informant  allopurinol (ZYLOPRIM) 300 MG tablet [id number] Yes Take 1 tablet by mouth daily. [provider] Taking Active   atorvastatin (LIPITOR) 20 MG tablet [id number] Yes Take 1 tablet by mouth daily. [provider] Taking Active   indapamide (LOZOL) 2.5 MG tablet [id number] Yes Take 1 tablet by mouth daily. [provider] Taking Active   metFORMIN (GLUCOPHAGE) 500 MG tablet [id number] Yes Take 2 tablets by mouth 2 (two) times daily. [provider] Taking Active   Omega-3 1000 MG CAPS [id number] Yes Take 1 capsule by mouth daily. [provider] Taking Active   quinapril (ACCUPRIL) 20 MG tablet [id number] Yes Take 0.5 tablets by mouth 2 (two) times daily. [provider] Taking Active   tamsulosin (FLOMAX) 0.4 MG CAPS capsule [id number] Yes Take 1 capsule by mouth daily. [provider] Taking Active   TRULICITY 2.35 TD/3.2KG SOPN [id number] Yes 1 Injection per week. [provider] Taking Active           Assessment:  Drugs sorted by system:  Cardiovascular: Atorvastatin, Indapamide, Quinapril, Omega-3 Fatty Acid  Endocrine: Metformin,  Trulicity  Renal: Allopurinol  Genitourinary: Tamsulosin  Medication Review Findings:  . BJS2G-3%- On Trulicity and Metformin On statin-Atorvastatin last filled 11/16/18-patient is taking has been taking his wife's Atorvastatin as her dose was recently changed.   (Patient will most likely be flagged for adherence. Requested patient call his atorvastatin in ASAP.)  Medication Assistance Findings:  Medication assistance needs identified: Trulicity    Additional medication assistance options reviewed with patient as warranted:  No other options identified  Plan: I will route patient assistance letter to [place] technician who will coordinate patient assistance program application process for medications listed above.  [facility] pharmacy technician will assist with obtaining all required documents from both patient and provider(s) and submit application(s) once completed.    Follow up with the patient in 4 weeks. (Ask about atorvastatin refill)  [name], PharmD, [place] Clinical Pharmacist [phone]

## 2019-04-12 ENCOUNTER — Ambulatory Visit: Payer: Self-pay

## 2019-04-15 ENCOUNTER — Other Ambulatory Visit: Payer: Self-pay

## 2019-04-15 NOTE — Patient Outreach (Signed)
[place] [facility]) Care Management  04/15/2019  SILVESTRE MINES 12-06-1945 [id number]   Medication Adherence call to Mr. Hermann Dottavio [place] Identifiers Verify spoke with patient he is past due on Atorvastatin 20 mg patient explain he takes 1 tablet daily he explain he is taking his wife Atorvastatin because she is no longer taking this medication and was prescribe another medication,patient has medication at this time. Mr. Reason is showing past due under [place].   [place] Management Direct Dial [phone]  Fax [phone] [email or web address]

## 2019-04-19 ENCOUNTER — Other Ambulatory Visit: Payer: Self-pay | Admitting: Pharmacy Technician

## 2019-04-19 NOTE — Patient Outreach (Signed)
[place] [facility]) Care Management  04/19/2019  Jose Grant 1946/06/02 [id number]  Successful outgoing call placed to patient in regards to [place] application for Trulicity.  Spoke to patient's wife, Jose Grant. HIPAA identifiers verified.  Patient's wife informed she has received his application and will be placing it in the mail tomorrow for pick up. She inquired as to how long this process will take. Informed patient's wife that once all information has been received then we will fax into the [place] normally takes between 5-7 business days to make a determination. Patient verbalized understanding.  Will followup with patient in 10-15 business days if application has not been received back.  Jose Grant, [place] Management [phone]

## 2019-04-22 ENCOUNTER — Other Ambulatory Visit: Payer: Self-pay | Admitting: Pharmacy Technician

## 2019-04-22 NOTE — Patient Outreach (Signed)
[place] [facility]) Care Management  04/22/2019  KAHLEN MORAIS 1946/07/15 [id number]    Received all necessary documents and signatures from both patient and provider for [name] patient assistance for Trulicity.  Submitted completed application via fax.  Will followup with [name] in 5-10 business days to inquire on status of application.  Margarine Grosshans P. Kayzlee Wirtanen, [place] Management [phone]

## 2019-04-30 ENCOUNTER — Other Ambulatory Visit: Payer: Self-pay | Admitting: Pharmacist

## 2019-04-30 NOTE — Patient Outreach (Signed)
[place] [facility]) Care Management  04/30/2019  Jose Grant 10-10-1945 [id number]   Patient called and wanted to know the status of his patient assistance application because he had not heard anything. HIPAA identifiers were obtained. Patient was informed that his application for Trulicity was sent to [facility] on 04/22/2019.  He was reminded the patient assistance process takes about 4-6 weeks.   Plan: Route note to [employer], CPhT to follow up with the patient and [employer].  [name], PharmD, [place] Clinical Pharmacist [phone]

## 2019-05-03 ENCOUNTER — Other Ambulatory Visit: Payer: Self-pay | Admitting: Pharmacy Technician

## 2019-05-03 ENCOUNTER — Telehealth: Payer: Self-pay | Admitting: Pharmacist

## 2019-05-03 NOTE — Patient Outreach (Signed)
[place] [facility]) Care Management  05/03/2019  TERRELL OSTRAND 08/01/1945 [id number]   Care coordination call placed to [place] in regards to patient's application for Trulicity.  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 If patient checked this box in error then he will have to call in to [place] at [phone] and update this information.  Will route note to Rachel that patient assistance has been completed due to patient being ineligible.  Clifford Coudriet P. Janelly Switalski, [place] Management [phone]

## 2019-05-03 NOTE — Patient Outreach (Signed)
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 If patient checked this box in error then he will have to call in to [place] at [phone] and update this information.  Left message a message on patient's voicemail to discuss x2 because he did not answer the phone either time I called.  Plan: Call patient back in 5-7 business days.  [name], PharmD, [place] Clinical Pharmacist [phone]

## 2019-05-04 ENCOUNTER — Other Ambulatory Visit: Payer: Self-pay | Admitting: Pharmacist

## 2019-05-04 NOTE — Patient Outreach (Signed)
[place] [facility]) Care Management  05/04/2019  Jose Grant 20-Feb-1946 [id number]  Patient returned my call from yesterday regarding patient assistance. HIPAA identifiers were obtained. The following was shared with the patient per Sharee Pimple Simcox's note from yesterday:    [employer] representative stated patient  is not eligible for the program because he checked on the application that he has Military/VA insurance. If patient checked this box in error then he will have to call in to [place] at [phone] and update this information.  Patient communicated understanding and said he would call [employer].  Plan: Follow up with patient at previously scheduled call back.  [name], PharmD, [place] Clinical Pharmacist [phone]

## 2019-05-17 ENCOUNTER — Other Ambulatory Visit: Payer: Self-pay | Admitting: Pharmacist

## 2019-05-17 NOTE — Patient Outreach (Signed)
[place] [facility]) Care Management  05/17/2019  Jose Grant 1946-01-20 [id number]   Patient was called to follow up on medication assistance. HIPAA identifiers were obtained. Patient confirmed that he called [employer] and explained his situation and [employer] said he was not eligible because he can use the [place]. Patient said his VA medications were more expensive than when he used [facility] so he does not use it.  Plan: Close patient's case as he is ineligible for patient assistance through the [place]. Patient said he has my number and will call me if anything changes. Case will gladly be reopened upon need.  [name], PharmD, [place] Clinical Pharmacist [phone]

## 2019-05-18 ENCOUNTER — Ambulatory Visit: Payer: Self-pay | Admitting: Pharmacist

## 2019-06-04 DIAGNOSIS — E1151 Type 2 diabetes mellitus with diabetic peripheral angiopathy without gangrene: Secondary | ICD-10-CM | POA: Diagnosis not present

## 2019-06-04 DIAGNOSIS — E1159 Type 2 diabetes mellitus with other circulatory complications: Secondary | ICD-10-CM | POA: Diagnosis not present

## 2019-06-04 DIAGNOSIS — E1169 Type 2 diabetes mellitus with other specified complication: Secondary | ICD-10-CM | POA: Diagnosis not present

## 2019-06-11 DIAGNOSIS — E1151 Type 2 diabetes mellitus with diabetic peripheral angiopathy without gangrene: Secondary | ICD-10-CM | POA: Diagnosis not present

## 2019-06-11 DIAGNOSIS — E785 Hyperlipidemia, unspecified: Secondary | ICD-10-CM | POA: Diagnosis not present

## 2019-06-11 DIAGNOSIS — N1831 Chronic kidney disease, stage 3a: Secondary | ICD-10-CM | POA: Diagnosis not present

## 2019-06-11 DIAGNOSIS — E1169 Type 2 diabetes mellitus with other specified complication: Secondary | ICD-10-CM | POA: Diagnosis not present

## 2019-10-07 DIAGNOSIS — E1151 Type 2 diabetes mellitus with diabetic peripheral angiopathy without gangrene: Secondary | ICD-10-CM | POA: Diagnosis not present

## 2019-10-07 DIAGNOSIS — E1159 Type 2 diabetes mellitus with other circulatory complications: Secondary | ICD-10-CM | POA: Diagnosis not present

## 2019-10-07 DIAGNOSIS — E1169 Type 2 diabetes mellitus with other specified complication: Secondary | ICD-10-CM | POA: Diagnosis not present

## 2019-10-15 DIAGNOSIS — E1151 Type 2 diabetes mellitus with diabetic peripheral angiopathy without gangrene: Secondary | ICD-10-CM | POA: Diagnosis not present

## 2019-10-15 DIAGNOSIS — E1159 Type 2 diabetes mellitus with other circulatory complications: Secondary | ICD-10-CM | POA: Diagnosis not present

## 2019-10-15 DIAGNOSIS — I152 Hypertension secondary to endocrine disorders: Secondary | ICD-10-CM | POA: Diagnosis not present

## 2019-10-15 DIAGNOSIS — E1169 Type 2 diabetes mellitus with other specified complication: Secondary | ICD-10-CM | POA: Diagnosis not present

## 2020-01-14 ENCOUNTER — Other Ambulatory Visit: Payer: Self-pay

## 2020-01-14 ENCOUNTER — Encounter (INDEPENDENT_AMBULATORY_CARE_PROVIDER_SITE_OTHER): Payer: Medicare Other | Admitting: Ophthalmology

## 2020-01-14 DIAGNOSIS — H43813 Vitreous degeneration, bilateral: Secondary | ICD-10-CM

## 2020-01-14 DIAGNOSIS — H35372 Puckering of macula, left eye: Secondary | ICD-10-CM | POA: Diagnosis not present

## 2020-01-14 DIAGNOSIS — H338 Other retinal detachments: Secondary | ICD-10-CM

## 2020-01-14 DIAGNOSIS — H33301 Unspecified retinal break, right eye: Secondary | ICD-10-CM

## 2020-01-14 DIAGNOSIS — H35033 Hypertensive retinopathy, bilateral: Secondary | ICD-10-CM | POA: Diagnosis not present

## 2020-01-14 DIAGNOSIS — E113293 Type 2 diabetes mellitus with mild nonproliferative diabetic retinopathy without macular edema, bilateral: Secondary | ICD-10-CM | POA: Diagnosis not present

## 2020-01-14 DIAGNOSIS — I10 Essential (primary) hypertension: Secondary | ICD-10-CM | POA: Diagnosis not present

## 2020-01-14 DIAGNOSIS — E11319 Type 2 diabetes mellitus with unspecified diabetic retinopathy without macular edema: Secondary | ICD-10-CM | POA: Diagnosis not present

## 2020-02-11 DIAGNOSIS — E1169 Type 2 diabetes mellitus with other specified complication: Secondary | ICD-10-CM | POA: Diagnosis not present

## 2020-02-21 DIAGNOSIS — Z139 Encounter for screening, unspecified: Secondary | ICD-10-CM | POA: Diagnosis not present

## 2020-02-21 DIAGNOSIS — E1165 Type 2 diabetes mellitus with hyperglycemia: Secondary | ICD-10-CM | POA: Diagnosis not present

## 2020-02-21 DIAGNOSIS — E1159 Type 2 diabetes mellitus with other circulatory complications: Secondary | ICD-10-CM | POA: Diagnosis not present

## 2020-02-21 DIAGNOSIS — R7989 Other specified abnormal findings of blood chemistry: Secondary | ICD-10-CM | POA: Diagnosis not present

## 2020-02-21 DIAGNOSIS — Z0000 Encounter for general adult medical examination without abnormal findings: Secondary | ICD-10-CM | POA: Diagnosis not present

## 2020-02-21 DIAGNOSIS — E1129 Type 2 diabetes mellitus with other diabetic kidney complication: Secondary | ICD-10-CM | POA: Diagnosis not present

## 2020-02-21 DIAGNOSIS — Z136 Encounter for screening for cardiovascular disorders: Secondary | ICD-10-CM | POA: Diagnosis not present

## 2020-02-21 DIAGNOSIS — I152 Hypertension secondary to endocrine disorders: Secondary | ICD-10-CM | POA: Diagnosis not present

## 2020-02-22 ENCOUNTER — Other Ambulatory Visit: Payer: Self-pay | Admitting: Pharmacy Technician

## 2020-02-22 NOTE — Patient Outreach (Signed)
[facility]) Care Management  02/22/2020  Jose Grant 07-05-1946 [id number]   Returned call placed to patient in response to voicemail left on my phone.  Spoke to patient, HIPAA identifiers verified.  Patient informed his provider provided him with our name and number as patient is having trouble affording Trulicity. He informed it is costing him over $200/month.  Informed patient that I would place a self referral in our system and have a pharmacist, Nunzio Cobbs, of our central pharmacy team, reach out to patient to explain the patient assistance process and to perform a clinical evaluation. Patient was agreeable to this plan.  Referral placed in [place].  Kylar Speelman P. Syrina Wake, CPhT [facility] [employer]  [phone]

## 2020-02-23 ENCOUNTER — Encounter: Payer: Self-pay | Admitting: Pharmacist

## 2020-02-28 ENCOUNTER — Other Ambulatory Visit: Payer: Self-pay | Admitting: Pharmacy Technician

## 2020-02-28 NOTE — Patient Outreach (Signed)
[facility]) Care Management  02/28/2020  LIBERATO STANSBERY Feb 11, 1946 [id number]                                      Medication Assistance Referral  Referral From: [facility] RPh Katina B.  Medication/Company: Trudee Kuster / [name] Patient application portion:  Mailed Provider application portion: Faxed  to Dr. Charlott Rakes Provider address/fax verified via: Office website     Follow up:  Will follow up with patient in 10-15 business days to confirm application(s) have been received.  Kaelee Pfeffer P. Ayaan Shutes, CPhT [facility] [employer]  [phone]

## 2020-03-03 DIAGNOSIS — R7989 Other specified abnormal findings of blood chemistry: Secondary | ICD-10-CM | POA: Diagnosis not present

## 2020-03-10 DIAGNOSIS — E349 Endocrine disorder, unspecified: Secondary | ICD-10-CM | POA: Diagnosis not present

## 2020-03-13 DIAGNOSIS — R7989 Other specified abnormal findings of blood chemistry: Secondary | ICD-10-CM | POA: Diagnosis not present

## 2020-03-14 ENCOUNTER — Other Ambulatory Visit: Payer: Self-pay | Admitting: Pharmacy Technician

## 2020-03-14 NOTE — Patient Outreach (Signed)
[facility]) Care Management  03/14/2020  CELESTINE BOUGIE 07-03-1946 [id number]   Received both patient and provider portion(s) of patient assistance application(s) for Trulicity. Faxed completed application and required documents into [name].  Will follow up with company(ies) in 5-10 business days to check status of application(s).  Charmin Aguiniga P. Jazmaine Fuelling, CPhT [facility] [employer]  [phone]

## 2020-03-21 ENCOUNTER — Other Ambulatory Visit: Payer: Self-pay | Admitting: Pharmacy Technician

## 2020-03-21 NOTE — Patient Outreach (Signed)
[facility]) Care Management  03/21/2020  TREVAN MESSMAN [birth year] [id number]  ADDENDUM  Successful outreach call placed to patient in regards to Trulicity application with [name].  Spoke to patient, HIPAA identifiers verified.  Informed patient he was approved for the program but needed to call [name] to set up his delivery as he has to be home to sign for the medication. Provided patient phone number to [name]. Patient was agreeable to call to set up his shipment.  Will follow up with patient in 7-10 business days to inquire if medication was received and to discuss how to obtain refills.  Chriselda Leppert P. Woodward Klem, CPhT [facility]  [phone]

## 2020-03-21 NOTE — Patient Outreach (Signed)
[facility]) Care Management  03/21/2020  Jose Grant 21-Mar-1946 [id number]   Care coordination call placed to [name] in regards to Trulicity application.  Spoke to [employer] who informed patient was APPROVED 03/14/2020-07/21/2020. She informed that patient should call into [name] to set up shipment.  Will outreach patient with this information.  Marcques Wrightsman P. Harli Engelken, CPhT [facility] [employer]  [phone]

## 2020-03-28 DIAGNOSIS — R7989 Other specified abnormal findings of blood chemistry: Secondary | ICD-10-CM | POA: Diagnosis not present

## 2020-03-30 ENCOUNTER — Other Ambulatory Visit: Payer: Self-pay | Admitting: Pharmacy Technician

## 2020-03-30 NOTE — Patient Outreach (Signed)
[facility]) Care Management  03/30/2020  WINSLOW EDERER May 30, 1946 [id number]  Unsuccessful outreach call placed to patient in regards to [name] application for Trulicity.  Unfortunately, patient did not answer the phone,   HIPAA compliant voicemail left.  Was calling to inquire if patient has received his shipment and to discuss refill procedure.  Will follow up with patient in 5-7 business days if call is not returned.  Kingjames Coury P. Constance Whittle, CPhT [facility] [employer]  [phone]

## 2020-04-06 ENCOUNTER — Other Ambulatory Visit: Payer: Self-pay | Admitting: Pharmacy Technician

## 2020-04-06 NOTE — Patient Outreach (Signed)
[facility]) Care Management  04/06/2020  Jose Grant 1946/07/14 [id number]   Successful call placed to patient regarding patient assistance medication delivery of Trulicity from [place], HIPAA identifiers verified.   Patient informs he received 4 boxes of Trulicity. Discussed refill procedure with patient which will require him to contact [name] if he is down to a 2 week supply and he has not heard from them concerning his auto ship refill. Informed patient where to find the phone number on the pharmacy label on the Trulicity box. Patient verbalized understanding. Confirmed patient had name and number as he had no other questions or concerns.  Follow up:  Will route note to [facility] RPh Nunzio Cobbs for case closure as patient assistance is complete.  Jose Grant, CPhT [facility] [employer]  [phone]

## 2020-04-11 DIAGNOSIS — R7989 Other specified abnormal findings of blood chemistry: Secondary | ICD-10-CM | POA: Diagnosis not present

## 2020-04-11 DIAGNOSIS — Z23 Encounter for immunization: Secondary | ICD-10-CM | POA: Diagnosis not present

## 2020-04-25 DIAGNOSIS — R7989 Other specified abnormal findings of blood chemistry: Secondary | ICD-10-CM | POA: Diagnosis not present

## 2020-05-09 DIAGNOSIS — R7989 Other specified abnormal findings of blood chemistry: Secondary | ICD-10-CM | POA: Diagnosis not present

## 2020-05-23 DIAGNOSIS — R7989 Other specified abnormal findings of blood chemistry: Secondary | ICD-10-CM | POA: Diagnosis not present

## 2020-06-22 DIAGNOSIS — R7989 Other specified abnormal findings of blood chemistry: Secondary | ICD-10-CM | POA: Diagnosis not present

## 2020-06-30 DIAGNOSIS — E1169 Type 2 diabetes mellitus with other specified complication: Secondary | ICD-10-CM | POA: Diagnosis not present

## 2020-07-10 DIAGNOSIS — R7989 Other specified abnormal findings of blood chemistry: Secondary | ICD-10-CM | POA: Diagnosis not present

## 2020-07-10 DIAGNOSIS — E785 Hyperlipidemia, unspecified: Secondary | ICD-10-CM | POA: Diagnosis not present

## 2020-07-10 DIAGNOSIS — E1169 Type 2 diabetes mellitus with other specified complication: Secondary | ICD-10-CM | POA: Diagnosis not present

## 2020-07-10 DIAGNOSIS — I152 Hypertension secondary to endocrine disorders: Secondary | ICD-10-CM | POA: Diagnosis not present

## 2020-07-10 DIAGNOSIS — E1159 Type 2 diabetes mellitus with other circulatory complications: Secondary | ICD-10-CM | POA: Diagnosis not present

## 2020-07-12 DIAGNOSIS — R7989 Other specified abnormal findings of blood chemistry: Secondary | ICD-10-CM | POA: Diagnosis not present

## 2020-07-26 DIAGNOSIS — R7989 Other specified abnormal findings of blood chemistry: Secondary | ICD-10-CM | POA: Diagnosis not present

## 2020-08-25 DIAGNOSIS — R7989 Other specified abnormal findings of blood chemistry: Secondary | ICD-10-CM | POA: Diagnosis not present

## 2020-09-22 DIAGNOSIS — R7989 Other specified abnormal findings of blood chemistry: Secondary | ICD-10-CM | POA: Diagnosis not present

## 2020-10-23 DIAGNOSIS — R7989 Other specified abnormal findings of blood chemistry: Secondary | ICD-10-CM | POA: Diagnosis not present

## 2020-11-10 DIAGNOSIS — E1169 Type 2 diabetes mellitus with other specified complication: Secondary | ICD-10-CM | POA: Diagnosis not present

## 2020-11-20 DIAGNOSIS — I10 Essential (primary) hypertension: Secondary | ICD-10-CM | POA: Diagnosis not present

## 2020-11-20 DIAGNOSIS — R7989 Other specified abnormal findings of blood chemistry: Secondary | ICD-10-CM | POA: Diagnosis not present

## 2020-11-20 DIAGNOSIS — E785 Hyperlipidemia, unspecified: Secondary | ICD-10-CM | POA: Diagnosis not present

## 2020-11-20 DIAGNOSIS — E1169 Type 2 diabetes mellitus with other specified complication: Secondary | ICD-10-CM | POA: Diagnosis not present

## 2020-11-20 DIAGNOSIS — N1831 Chronic kidney disease, stage 3a: Secondary | ICD-10-CM | POA: Diagnosis not present

## 2020-12-15 DIAGNOSIS — R7989 Other specified abnormal findings of blood chemistry: Secondary | ICD-10-CM | POA: Diagnosis not present

## 2021-01-05 DIAGNOSIS — R7989 Other specified abnormal findings of blood chemistry: Secondary | ICD-10-CM | POA: Diagnosis not present

## 2021-01-15 ENCOUNTER — Other Ambulatory Visit: Payer: Self-pay

## 2021-01-15 ENCOUNTER — Encounter (INDEPENDENT_AMBULATORY_CARE_PROVIDER_SITE_OTHER): Payer: Medicare Other | Admitting: Ophthalmology

## 2021-01-15 DIAGNOSIS — H35033 Hypertensive retinopathy, bilateral: Secondary | ICD-10-CM

## 2021-01-15 DIAGNOSIS — I10 Essential (primary) hypertension: Secondary | ICD-10-CM

## 2021-01-15 DIAGNOSIS — H33301 Unspecified retinal break, right eye: Secondary | ICD-10-CM | POA: Diagnosis not present

## 2021-01-15 DIAGNOSIS — H43813 Vitreous degeneration, bilateral: Secondary | ICD-10-CM | POA: Diagnosis not present

## 2021-01-15 DIAGNOSIS — H338 Other retinal detachments: Secondary | ICD-10-CM

## 2021-01-15 DIAGNOSIS — E113293 Type 2 diabetes mellitus with mild nonproliferative diabetic retinopathy without macular edema, bilateral: Secondary | ICD-10-CM

## 2021-01-26 DIAGNOSIS — R7989 Other specified abnormal findings of blood chemistry: Secondary | ICD-10-CM | POA: Diagnosis not present

## 2021-02-12 DIAGNOSIS — I152 Hypertension secondary to endocrine disorders: Secondary | ICD-10-CM | POA: Diagnosis not present

## 2021-02-12 DIAGNOSIS — R7989 Other specified abnormal findings of blood chemistry: Secondary | ICD-10-CM | POA: Diagnosis not present

## 2021-02-12 DIAGNOSIS — E1159 Type 2 diabetes mellitus with other circulatory complications: Secondary | ICD-10-CM | POA: Diagnosis not present

## 2021-03-28 DIAGNOSIS — E1169 Type 2 diabetes mellitus with other specified complication: Secondary | ICD-10-CM | POA: Diagnosis not present

## 2021-04-02 DIAGNOSIS — Z23 Encounter for immunization: Secondary | ICD-10-CM | POA: Diagnosis not present

## 2021-04-02 DIAGNOSIS — E785 Hyperlipidemia, unspecified: Secondary | ICD-10-CM | POA: Diagnosis not present

## 2021-04-02 DIAGNOSIS — E1159 Type 2 diabetes mellitus with other circulatory complications: Secondary | ICD-10-CM | POA: Diagnosis not present

## 2021-04-02 DIAGNOSIS — E1151 Type 2 diabetes mellitus with diabetic peripheral angiopathy without gangrene: Secondary | ICD-10-CM | POA: Diagnosis not present

## 2021-04-02 DIAGNOSIS — I152 Hypertension secondary to endocrine disorders: Secondary | ICD-10-CM | POA: Diagnosis not present

## 2021-04-02 DIAGNOSIS — E1169 Type 2 diabetes mellitus with other specified complication: Secondary | ICD-10-CM | POA: Diagnosis not present

## 2021-04-09 DIAGNOSIS — Z87442 Personal history of urinary calculi: Secondary | ICD-10-CM | POA: Diagnosis not present

## 2021-04-09 DIAGNOSIS — R3914 Feeling of incomplete bladder emptying: Secondary | ICD-10-CM | POA: Diagnosis not present

## 2021-04-27 DIAGNOSIS — R350 Frequency of micturition: Secondary | ICD-10-CM | POA: Diagnosis not present

## 2021-04-27 DIAGNOSIS — R3914 Feeling of incomplete bladder emptying: Secondary | ICD-10-CM | POA: Diagnosis not present

## 2021-04-27 DIAGNOSIS — R3916 Straining to void: Secondary | ICD-10-CM | POA: Diagnosis not present

## 2021-05-01 DIAGNOSIS — N138 Other obstructive and reflux uropathy: Secondary | ICD-10-CM | POA: Diagnosis not present

## 2021-05-01 DIAGNOSIS — R350 Frequency of micturition: Secondary | ICD-10-CM | POA: Diagnosis not present

## 2021-05-01 DIAGNOSIS — R3914 Feeling of incomplete bladder emptying: Secondary | ICD-10-CM | POA: Diagnosis not present

## 2021-05-22 ENCOUNTER — Telehealth: Payer: Self-pay | Admitting: Pharmacy Technician

## 2021-05-22 DIAGNOSIS — Z596 Low income: Secondary | ICD-10-CM

## 2021-05-22 NOTE — Progress Notes (Signed)
[facility])                                            [facility] Quality Pharmacy Team    05/22/2021  Jose Grant 1946-01-18 [id number]                                      Medication Assistance Referral  Referral From: [facility] RPh Bethany B.   Medication/Company: Trudee Kuster / [name] Patient application portion:  Mailed Provider application portion: Faxed  to Dr. Aurora Mask Provider address/fax verified via: Office website  Medication/Company: [place] Pepper / BI Patient application portion:  Mailed Provider application portion: Faxed  to Dr. Aurora Mask Provider address/fax verified via: Office website  Jose Grant, CPhT [facility] [employer]  [phone]

## 2021-06-01 DIAGNOSIS — R9431 Abnormal electrocardiogram [ECG] [EKG]: Secondary | ICD-10-CM | POA: Diagnosis not present

## 2021-06-01 DIAGNOSIS — Z01812 Encounter for preprocedural laboratory examination: Secondary | ICD-10-CM | POA: Diagnosis not present

## 2021-06-01 DIAGNOSIS — Z01810 Encounter for preprocedural cardiovascular examination: Secondary | ICD-10-CM | POA: Diagnosis not present

## 2021-06-08 DIAGNOSIS — E119 Type 2 diabetes mellitus without complications: Secondary | ICD-10-CM | POA: Diagnosis not present

## 2021-06-08 DIAGNOSIS — N138 Other obstructive and reflux uropathy: Secondary | ICD-10-CM | POA: Diagnosis not present

## 2021-06-08 DIAGNOSIS — Z7984 Long term (current) use of oral hypoglycemic drugs: Secondary | ICD-10-CM | POA: Diagnosis not present

## 2021-06-20 DIAGNOSIS — Z87442 Personal history of urinary calculi: Secondary | ICD-10-CM | POA: Diagnosis not present

## 2021-06-20 DIAGNOSIS — Z9889 Other specified postprocedural states: Secondary | ICD-10-CM | POA: Diagnosis not present

## 2021-06-23 DIAGNOSIS — R1110 Vomiting, unspecified: Secondary | ICD-10-CM | POA: Diagnosis not present

## 2021-06-23 DIAGNOSIS — E1136 Type 2 diabetes mellitus with diabetic cataract: Secondary | ICD-10-CM | POA: Diagnosis not present

## 2021-06-23 DIAGNOSIS — I517 Cardiomegaly: Secondary | ICD-10-CM | POA: Diagnosis not present

## 2021-06-23 DIAGNOSIS — R61 Generalized hyperhidrosis: Secondary | ICD-10-CM | POA: Diagnosis not present

## 2021-06-23 DIAGNOSIS — Z79899 Other long term (current) drug therapy: Secondary | ICD-10-CM | POA: Diagnosis not present

## 2021-06-23 DIAGNOSIS — I444 Left anterior fascicular block: Secondary | ICD-10-CM | POA: Diagnosis not present

## 2021-06-23 DIAGNOSIS — R001 Bradycardia, unspecified: Secondary | ICD-10-CM | POA: Diagnosis not present

## 2021-06-23 DIAGNOSIS — I213 ST elevation (STEMI) myocardial infarction of unspecified site: Secondary | ICD-10-CM | POA: Diagnosis not present

## 2021-06-23 DIAGNOSIS — I10 Essential (primary) hypertension: Secondary | ICD-10-CM | POA: Diagnosis not present

## 2021-06-23 DIAGNOSIS — Z87891 Personal history of nicotine dependence: Secondary | ICD-10-CM | POA: Diagnosis not present

## 2021-06-23 DIAGNOSIS — E119 Type 2 diabetes mellitus without complications: Secondary | ICD-10-CM | POA: Diagnosis not present

## 2021-06-23 DIAGNOSIS — I440 Atrioventricular block, first degree: Secondary | ICD-10-CM | POA: Diagnosis not present

## 2021-06-23 DIAGNOSIS — J984 Other disorders of lung: Secondary | ICD-10-CM | POA: Diagnosis not present

## 2021-06-23 DIAGNOSIS — Z794 Long term (current) use of insulin: Secondary | ICD-10-CM | POA: Diagnosis not present

## 2021-06-23 DIAGNOSIS — R55 Syncope and collapse: Secondary | ICD-10-CM | POA: Diagnosis not present

## 2021-06-23 DIAGNOSIS — R42 Dizziness and giddiness: Secondary | ICD-10-CM | POA: Diagnosis not present

## 2021-06-23 DIAGNOSIS — Z7982 Long term (current) use of aspirin: Secondary | ICD-10-CM | POA: Diagnosis not present

## 2021-06-23 DIAGNOSIS — E785 Hyperlipidemia, unspecified: Secondary | ICD-10-CM | POA: Diagnosis not present

## 2021-06-23 DIAGNOSIS — I442 Atrioventricular block, complete: Secondary | ICD-10-CM | POA: Diagnosis not present

## 2021-06-23 DIAGNOSIS — I2119 ST elevation (STEMI) myocardial infarction involving other coronary artery of inferior wall: Secondary | ICD-10-CM | POA: Diagnosis not present

## 2021-06-23 DIAGNOSIS — Z9889 Other specified postprocedural states: Secondary | ICD-10-CM | POA: Diagnosis not present

## 2021-06-23 DIAGNOSIS — I2510 Atherosclerotic heart disease of native coronary artery without angina pectoris: Secondary | ICD-10-CM | POA: Diagnosis not present

## 2021-06-23 DIAGNOSIS — Z8249 Family history of ischemic heart disease and other diseases of the circulatory system: Secondary | ICD-10-CM | POA: Diagnosis not present

## 2021-06-23 DIAGNOSIS — Z95818 Presence of other cardiac implants and grafts: Secondary | ICD-10-CM | POA: Diagnosis not present

## 2021-06-23 DIAGNOSIS — Z833 Family history of diabetes mellitus: Secondary | ICD-10-CM | POA: Diagnosis not present

## 2021-06-23 DIAGNOSIS — I2111 ST elevation (STEMI) myocardial infarction involving right coronary artery: Secondary | ICD-10-CM | POA: Diagnosis not present

## 2021-06-23 DIAGNOSIS — E7800 Pure hypercholesterolemia, unspecified: Secondary | ICD-10-CM | POA: Diagnosis not present

## 2021-06-29 DIAGNOSIS — E1169 Type 2 diabetes mellitus with other specified complication: Secondary | ICD-10-CM | POA: Diagnosis not present

## 2021-06-29 DIAGNOSIS — Z136 Encounter for screening for cardiovascular disorders: Secondary | ICD-10-CM | POA: Diagnosis not present

## 2021-06-29 DIAGNOSIS — Z139 Encounter for screening, unspecified: Secondary | ICD-10-CM | POA: Diagnosis not present

## 2021-06-29 DIAGNOSIS — I2510 Atherosclerotic heart disease of native coronary artery without angina pectoris: Secondary | ICD-10-CM | POA: Diagnosis not present

## 2021-06-29 DIAGNOSIS — Z7689 Persons encountering health services in other specified circumstances: Secondary | ICD-10-CM | POA: Diagnosis not present

## 2021-06-29 DIAGNOSIS — E785 Hyperlipidemia, unspecified: Secondary | ICD-10-CM | POA: Diagnosis not present

## 2021-06-29 DIAGNOSIS — Z0000 Encounter for general adult medical examination without abnormal findings: Secondary | ICD-10-CM | POA: Diagnosis not present

## 2021-06-29 DIAGNOSIS — I10 Essential (primary) hypertension: Secondary | ICD-10-CM | POA: Diagnosis not present

## 2021-07-10 DIAGNOSIS — Z7982 Long term (current) use of aspirin: Secondary | ICD-10-CM | POA: Diagnosis not present

## 2021-07-10 DIAGNOSIS — Z9889 Other specified postprocedural states: Secondary | ICD-10-CM | POA: Diagnosis not present

## 2021-07-10 DIAGNOSIS — I259 Chronic ischemic heart disease, unspecified: Secondary | ICD-10-CM | POA: Diagnosis not present

## 2021-07-10 DIAGNOSIS — I252 Old myocardial infarction: Secondary | ICD-10-CM | POA: Diagnosis not present

## 2021-07-11 ENCOUNTER — Telehealth: Payer: Self-pay | Admitting: Pharmacy Technician

## 2021-07-11 DIAGNOSIS — Z596 Low income: Secondary | ICD-10-CM

## 2021-07-11 NOTE — Progress Notes (Addendum)
[facility])                                            [facility] Quality Pharmacy Team   ADDENDUM 07/11/2021  Care coordination call placed to BI today in regard to [facility] application. Spoke to [place] who informs proof of income is needed to continue the application process. Proof of income submitted today.  Kentrel Clevenger P. Carolyne Fiscal [facility]  [phone]  06/11/2021  KALLEN MCCRYSTAL Nov 26, 1945 [id number]  Received both patient and provider  portion(s) of patient assistance application(s) for Trulicity and [place]. Faxed completed application and required documents into [name] and BI on 06/11/2021.    Aveena Bari P. La [place], CPhT [facility] [employer]  [phone] s

## 2021-07-18 ENCOUNTER — Telehealth: Payer: Self-pay | Admitting: Pharmacy Technician

## 2021-07-18 DIAGNOSIS — Z596 Low income: Secondary | ICD-10-CM

## 2021-07-18 NOTE — Progress Notes (Signed)
[facility])                                            [facility] Quality Pharmacy Team    07/18/2021  Jose Grant [birth year] [id number]  Care coordination calls placed to [facility] in regards to [place] and [name] in regards to Trulicity.  Care coordination call placed to BI in regard to [facility] application. Spoke to [place] who informs patient is DENIED as he is over income for their program. 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 She informs based on the 1040 tax return they take lines 6a-6b and get a figure and then add that to line 9 to get the income that they use for their program. She informs he can submit his new tax return in 2023 within 6 months IF his income has changed and is below the 250% FPL.  Care coordination call placed to [name] in regard to Trulicity application. Spoke to [employer] who informs patient is APPROVED 07/22/21-07/21/22. She informs medication will begin to ship in January based on when patient last go the medication refilled in 2023. The medication will be delivered to his home.  Cynde Menard P. Shawna Wearing, CPhT [facility] [employer]  [phone]

## 2021-08-28 DIAGNOSIS — I252 Old myocardial infarction: Secondary | ICD-10-CM | POA: Diagnosis not present

## 2021-08-28 DIAGNOSIS — Z5189 Encounter for other specified aftercare: Secondary | ICD-10-CM | POA: Diagnosis not present

## 2021-08-29 DIAGNOSIS — I252 Old myocardial infarction: Secondary | ICD-10-CM | POA: Diagnosis not present

## 2021-08-29 DIAGNOSIS — Z5189 Encounter for other specified aftercare: Secondary | ICD-10-CM | POA: Diagnosis not present

## 2021-08-30 DIAGNOSIS — Z5189 Encounter for other specified aftercare: Secondary | ICD-10-CM | POA: Diagnosis not present

## 2021-08-30 DIAGNOSIS — I252 Old myocardial infarction: Secondary | ICD-10-CM | POA: Diagnosis not present

## 2021-08-31 DIAGNOSIS — E1169 Type 2 diabetes mellitus with other specified complication: Secondary | ICD-10-CM | POA: Diagnosis not present

## 2021-09-03 DIAGNOSIS — I252 Old myocardial infarction: Secondary | ICD-10-CM | POA: Diagnosis not present

## 2021-09-03 DIAGNOSIS — Z5189 Encounter for other specified aftercare: Secondary | ICD-10-CM | POA: Diagnosis not present

## 2021-09-05 DIAGNOSIS — Z5189 Encounter for other specified aftercare: Secondary | ICD-10-CM | POA: Diagnosis not present

## 2021-09-05 DIAGNOSIS — I252 Old myocardial infarction: Secondary | ICD-10-CM | POA: Diagnosis not present

## 2021-09-06 DIAGNOSIS — Z5189 Encounter for other specified aftercare: Secondary | ICD-10-CM | POA: Diagnosis not present

## 2021-09-06 DIAGNOSIS — I252 Old myocardial infarction: Secondary | ICD-10-CM | POA: Diagnosis not present

## 2021-09-07 DIAGNOSIS — E785 Hyperlipidemia, unspecified: Secondary | ICD-10-CM | POA: Diagnosis not present

## 2021-09-07 DIAGNOSIS — Z139 Encounter for screening, unspecified: Secondary | ICD-10-CM | POA: Diagnosis not present

## 2021-09-07 DIAGNOSIS — E1169 Type 2 diabetes mellitus with other specified complication: Secondary | ICD-10-CM | POA: Diagnosis not present

## 2021-09-07 DIAGNOSIS — I152 Hypertension secondary to endocrine disorders: Secondary | ICD-10-CM | POA: Diagnosis not present

## 2021-09-07 DIAGNOSIS — E1159 Type 2 diabetes mellitus with other circulatory complications: Secondary | ICD-10-CM | POA: Diagnosis not present

## 2021-09-10 DIAGNOSIS — I252 Old myocardial infarction: Secondary | ICD-10-CM | POA: Diagnosis not present

## 2021-09-10 DIAGNOSIS — Z5189 Encounter for other specified aftercare: Secondary | ICD-10-CM | POA: Diagnosis not present

## 2021-09-12 DIAGNOSIS — Z5189 Encounter for other specified aftercare: Secondary | ICD-10-CM | POA: Diagnosis not present

## 2021-09-12 DIAGNOSIS — I252 Old myocardial infarction: Secondary | ICD-10-CM | POA: Diagnosis not present

## 2021-09-13 DIAGNOSIS — Z5189 Encounter for other specified aftercare: Secondary | ICD-10-CM | POA: Diagnosis not present

## 2021-09-13 DIAGNOSIS — I252 Old myocardial infarction: Secondary | ICD-10-CM | POA: Diagnosis not present

## 2021-09-17 DIAGNOSIS — Z5189 Encounter for other specified aftercare: Secondary | ICD-10-CM | POA: Diagnosis not present

## 2021-09-17 DIAGNOSIS — I252 Old myocardial infarction: Secondary | ICD-10-CM | POA: Diagnosis not present

## 2021-09-19 DIAGNOSIS — Z5189 Encounter for other specified aftercare: Secondary | ICD-10-CM | POA: Diagnosis not present

## 2021-09-19 DIAGNOSIS — I252 Old myocardial infarction: Secondary | ICD-10-CM | POA: Diagnosis not present

## 2021-09-20 DIAGNOSIS — Z5189 Encounter for other specified aftercare: Secondary | ICD-10-CM | POA: Diagnosis not present

## 2021-09-20 DIAGNOSIS — I252 Old myocardial infarction: Secondary | ICD-10-CM | POA: Diagnosis not present

## 2021-09-24 DIAGNOSIS — I252 Old myocardial infarction: Secondary | ICD-10-CM | POA: Diagnosis not present

## 2021-09-24 DIAGNOSIS — Z5189 Encounter for other specified aftercare: Secondary | ICD-10-CM | POA: Diagnosis not present

## 2021-09-26 DIAGNOSIS — Z5189 Encounter for other specified aftercare: Secondary | ICD-10-CM | POA: Diagnosis not present

## 2021-09-26 DIAGNOSIS — I252 Old myocardial infarction: Secondary | ICD-10-CM | POA: Diagnosis not present

## 2021-09-27 DIAGNOSIS — Z5189 Encounter for other specified aftercare: Secondary | ICD-10-CM | POA: Diagnosis not present

## 2021-09-27 DIAGNOSIS — I252 Old myocardial infarction: Secondary | ICD-10-CM | POA: Diagnosis not present

## 2021-10-03 DIAGNOSIS — Z5189 Encounter for other specified aftercare: Secondary | ICD-10-CM | POA: Diagnosis not present

## 2021-10-03 DIAGNOSIS — I252 Old myocardial infarction: Secondary | ICD-10-CM | POA: Diagnosis not present

## 2021-10-04 DIAGNOSIS — Z5189 Encounter for other specified aftercare: Secondary | ICD-10-CM | POA: Diagnosis not present

## 2021-10-04 DIAGNOSIS — I252 Old myocardial infarction: Secondary | ICD-10-CM | POA: Diagnosis not present

## 2021-10-08 DIAGNOSIS — Z5189 Encounter for other specified aftercare: Secondary | ICD-10-CM | POA: Diagnosis not present

## 2021-10-08 DIAGNOSIS — I252 Old myocardial infarction: Secondary | ICD-10-CM | POA: Diagnosis not present

## 2021-10-10 DIAGNOSIS — Z5189 Encounter for other specified aftercare: Secondary | ICD-10-CM | POA: Diagnosis not present

## 2021-10-10 DIAGNOSIS — I252 Old myocardial infarction: Secondary | ICD-10-CM | POA: Diagnosis not present

## 2021-10-11 DIAGNOSIS — Z5189 Encounter for other specified aftercare: Secondary | ICD-10-CM | POA: Diagnosis not present

## 2021-10-11 DIAGNOSIS — I252 Old myocardial infarction: Secondary | ICD-10-CM | POA: Diagnosis not present

## 2021-10-15 DIAGNOSIS — Z5189 Encounter for other specified aftercare: Secondary | ICD-10-CM | POA: Diagnosis not present

## 2021-10-15 DIAGNOSIS — I252 Old myocardial infarction: Secondary | ICD-10-CM | POA: Diagnosis not present

## 2021-10-17 DIAGNOSIS — I252 Old myocardial infarction: Secondary | ICD-10-CM | POA: Diagnosis not present

## 2021-10-17 DIAGNOSIS — Z5189 Encounter for other specified aftercare: Secondary | ICD-10-CM | POA: Diagnosis not present

## 2021-10-18 DIAGNOSIS — Z5189 Encounter for other specified aftercare: Secondary | ICD-10-CM | POA: Diagnosis not present

## 2021-10-18 DIAGNOSIS — I252 Old myocardial infarction: Secondary | ICD-10-CM | POA: Diagnosis not present

## 2021-10-22 DIAGNOSIS — I213 ST elevation (STEMI) myocardial infarction of unspecified site: Secondary | ICD-10-CM | POA: Diagnosis not present

## 2021-10-24 DIAGNOSIS — I213 ST elevation (STEMI) myocardial infarction of unspecified site: Secondary | ICD-10-CM | POA: Diagnosis not present

## 2021-10-25 DIAGNOSIS — I213 ST elevation (STEMI) myocardial infarction of unspecified site: Secondary | ICD-10-CM | POA: Diagnosis not present

## 2021-10-29 DIAGNOSIS — I213 ST elevation (STEMI) myocardial infarction of unspecified site: Secondary | ICD-10-CM | POA: Diagnosis not present

## 2021-10-31 DIAGNOSIS — I213 ST elevation (STEMI) myocardial infarction of unspecified site: Secondary | ICD-10-CM | POA: Diagnosis not present

## 2021-11-01 DIAGNOSIS — I213 ST elevation (STEMI) myocardial infarction of unspecified site: Secondary | ICD-10-CM | POA: Diagnosis not present

## 2021-11-05 DIAGNOSIS — I213 ST elevation (STEMI) myocardial infarction of unspecified site: Secondary | ICD-10-CM | POA: Diagnosis not present

## 2021-11-07 DIAGNOSIS — I213 ST elevation (STEMI) myocardial infarction of unspecified site: Secondary | ICD-10-CM | POA: Diagnosis not present

## 2021-11-08 DIAGNOSIS — I213 ST elevation (STEMI) myocardial infarction of unspecified site: Secondary | ICD-10-CM | POA: Diagnosis not present

## 2021-11-12 DIAGNOSIS — I213 ST elevation (STEMI) myocardial infarction of unspecified site: Secondary | ICD-10-CM | POA: Diagnosis not present

## 2021-11-14 DIAGNOSIS — I213 ST elevation (STEMI) myocardial infarction of unspecified site: Secondary | ICD-10-CM | POA: Diagnosis not present

## 2021-11-15 DIAGNOSIS — I213 ST elevation (STEMI) myocardial infarction of unspecified site: Secondary | ICD-10-CM | POA: Diagnosis not present

## 2021-11-19 DIAGNOSIS — I213 ST elevation (STEMI) myocardial infarction of unspecified site: Secondary | ICD-10-CM | POA: Diagnosis not present

## 2021-12-27 DIAGNOSIS — E1169 Type 2 diabetes mellitus with other specified complication: Secondary | ICD-10-CM | POA: Diagnosis not present

## 2022-01-04 DIAGNOSIS — Z139 Encounter for screening, unspecified: Secondary | ICD-10-CM | POA: Diagnosis not present

## 2022-01-04 DIAGNOSIS — E785 Hyperlipidemia, unspecified: Secondary | ICD-10-CM | POA: Diagnosis not present

## 2022-01-04 DIAGNOSIS — I152 Hypertension secondary to endocrine disorders: Secondary | ICD-10-CM | POA: Diagnosis not present

## 2022-01-04 DIAGNOSIS — E1159 Type 2 diabetes mellitus with other circulatory complications: Secondary | ICD-10-CM | POA: Diagnosis not present

## 2022-01-04 DIAGNOSIS — E1151 Type 2 diabetes mellitus with diabetic peripheral angiopathy without gangrene: Secondary | ICD-10-CM | POA: Diagnosis not present

## 2022-01-04 DIAGNOSIS — N1831 Chronic kidney disease, stage 3a: Secondary | ICD-10-CM | POA: Diagnosis not present

## 2022-01-15 DIAGNOSIS — I444 Left anterior fascicular block: Secondary | ICD-10-CM | POA: Diagnosis not present

## 2022-01-15 DIAGNOSIS — I259 Chronic ischemic heart disease, unspecified: Secondary | ICD-10-CM | POA: Diagnosis not present

## 2022-01-16 ENCOUNTER — Encounter (INDEPENDENT_AMBULATORY_CARE_PROVIDER_SITE_OTHER): Payer: Medicare Other | Admitting: Ophthalmology

## 2022-01-16 DIAGNOSIS — H33301 Unspecified retinal break, right eye: Secondary | ICD-10-CM | POA: Diagnosis not present

## 2022-01-16 DIAGNOSIS — H35033 Hypertensive retinopathy, bilateral: Secondary | ICD-10-CM | POA: Diagnosis not present

## 2022-01-16 DIAGNOSIS — I10 Essential (primary) hypertension: Secondary | ICD-10-CM

## 2022-01-16 DIAGNOSIS — H338 Other retinal detachments: Secondary | ICD-10-CM | POA: Diagnosis not present

## 2022-01-16 DIAGNOSIS — H43813 Vitreous degeneration, bilateral: Secondary | ICD-10-CM

## 2022-01-16 DIAGNOSIS — E113393 Type 2 diabetes mellitus with moderate nonproliferative diabetic retinopathy without macular edema, bilateral: Secondary | ICD-10-CM | POA: Diagnosis not present

## 2022-04-24 DIAGNOSIS — E1151 Type 2 diabetes mellitus with diabetic peripheral angiopathy without gangrene: Secondary | ICD-10-CM | POA: Diagnosis not present

## 2022-04-24 DIAGNOSIS — E1159 Type 2 diabetes mellitus with other circulatory complications: Secondary | ICD-10-CM | POA: Diagnosis not present

## 2022-04-24 DIAGNOSIS — R7989 Other specified abnormal findings of blood chemistry: Secondary | ICD-10-CM | POA: Diagnosis not present

## 2022-04-24 DIAGNOSIS — E1169 Type 2 diabetes mellitus with other specified complication: Secondary | ICD-10-CM | POA: Diagnosis not present

## 2022-04-29 ENCOUNTER — Telehealth: Payer: Self-pay | Admitting: Pharmacy Technician

## 2022-04-29 DIAGNOSIS — Z596 Low income: Secondary | ICD-10-CM

## 2022-04-29 NOTE — Progress Notes (Addendum)
[facility])                                            [place] Team    04/29/2022  JW COVIN 1946/03/19 [id number]                                      Medication Assistance Referral-FOR 2024 RE ENROLLMENT  Referral From: [place]  Medication/Company: Kingsley Spittle Patient application portion:  Mailed Provider application portion: Faxed  to Dr. Maryella Shivers Provider address/fax verified via: Office website  Jose Grant P. Jose Grant, [place]  [phone]

## 2022-05-06 DIAGNOSIS — E1151 Type 2 diabetes mellitus with diabetic peripheral angiopathy without gangrene: Secondary | ICD-10-CM | POA: Diagnosis not present

## 2022-05-06 DIAGNOSIS — N1831 Chronic kidney disease, stage 3a: Secondary | ICD-10-CM | POA: Diagnosis not present

## 2022-05-06 DIAGNOSIS — I152 Hypertension secondary to endocrine disorders: Secondary | ICD-10-CM | POA: Diagnosis not present

## 2022-05-06 DIAGNOSIS — E785 Hyperlipidemia, unspecified: Secondary | ICD-10-CM | POA: Diagnosis not present

## 2022-05-06 DIAGNOSIS — E1159 Type 2 diabetes mellitus with other circulatory complications: Secondary | ICD-10-CM | POA: Diagnosis not present

## 2022-05-06 DIAGNOSIS — Z139 Encounter for screening, unspecified: Secondary | ICD-10-CM | POA: Diagnosis not present

## 2022-05-10 ENCOUNTER — Telehealth: Payer: Self-pay

## 2022-05-10 DIAGNOSIS — Z5986 Financial insecurity: Secondary | ICD-10-CM

## 2022-05-10 NOTE — Progress Notes (Signed)
[facility] [facility]) Care Management  [place]   05/10/2022  Jose Grant 04-14-1946 [id number]  Reason for referral: Medication Assistance   Referral source: Received referral from Martinique Jones from [facility]  Referral medication(s): Jaridiance  Current insurance:[employer] [place]   [place] team received a referral for Jardiance. However, due to social security income and other sources of income patent does not qualify for medication assistance for Jardiance. His combined household income qualifies for [place]. Discussed alternative therapy of Farxiga and patient was agreeable. Informed him I will reach out to PCP regarding therapeutically equivalent change from [place] to [place].   Medication Assistance Findings:  Medication assistance needs identified: Farxiga     Plan: I will fax Dr. Maryella Shivers regarding therapeutic change from [place] to [place] due to patient's qualifications.   Thank you for allowing pharmacy to be a part of this patient's care.  Kristeen Miss, PharmD Clinical Pharmacist [place] Cell: [phone]

## 2022-05-24 ENCOUNTER — Telehealth: Payer: Self-pay | Admitting: Pharmacy Technician

## 2022-05-24 DIAGNOSIS — Z596 Low income: Secondary | ICD-10-CM

## 2022-05-24 NOTE — Progress Notes (Signed)
[facility])                                            [place] Team    05/24/2022  LOTTIE SISKA [birth year] [id number]                                      Medication Assistance Referral  Referral From:  [facility]  Kristeen Miss  Medication/Company: Wilder Glade / [place] Patient application portion:  Mailed Provider application portion: Faxed  to Dr. Maryella Shivers Provider address/fax verified via: Office website  Viva Gallaher P. Halen Antenucci, [place]  [phone]

## 2022-06-05 ENCOUNTER — Telehealth: Payer: Self-pay | Admitting: Pharmacy Technician

## 2022-06-05 DIAGNOSIS — Z596 Low income: Secondary | ICD-10-CM

## 2022-06-05 NOTE — Progress Notes (Signed)
[facility])                                            [facility] Quality Pharmacy Team    06/05/2022  ISIAIH HOLLENBACH [birth year] [id number]  Received both patient and provider portion(s) of patient assistance application(s) for Trulicity. Faxed completed application and required documents into [name].  Asser Lucena P. Nobel Brar, CPhT [facility] [employer] [employer]  [phone]

## 2022-06-20 ENCOUNTER — Telehealth: Payer: Self-pay | Admitting: Pharmacy Technician

## 2022-06-20 DIAGNOSIS — Z596 Low income: Secondary | ICD-10-CM

## 2022-06-20 NOTE — Progress Notes (Addendum)
[facility])                                            [facility] Quality Pharmacy Team   06/28/2022 Received fax from [employer] that a date was not legible. Re faxed to [employer].  06/20/2022  Jose Grant Nov 14, 1945 [id number]  Received both patient and provider portion(s) of patient assistance application(s) for [place]. Faxed completed application and required documents into [employer].   Carrolyn Hilmes P. Heaton Sarin, CPhT [employer] [employer]  [phone]

## 2022-07-01 ENCOUNTER — Telehealth: Payer: Self-pay | Admitting: Pharmacy Technician

## 2022-07-01 DIAGNOSIS — Z596 Low income: Secondary | ICD-10-CM

## 2022-07-01 NOTE — Progress Notes (Signed)
[facility])                                            [facility] Quality Pharmacy Team    07/01/2022  Jose Grant 10-10-1945 [id number]  Care coordination call placed to [employer] in regard to [facility] application.  Spoke to Iris who informs patient is APPROVED 06/21/22-07/22/23. She informs medication will be delivered to patient's home. Patient is aware of his approval.  Breckyn Troyer P. Shaddai Shapley, CPhT [employer]  [phone]

## 2022-07-03 DIAGNOSIS — Z0189 Encounter for other specified special examinations: Secondary | ICD-10-CM | POA: Diagnosis not present

## 2022-07-03 DIAGNOSIS — Z0000 Encounter for general adult medical examination without abnormal findings: Secondary | ICD-10-CM | POA: Diagnosis not present

## 2022-07-03 DIAGNOSIS — Z139 Encounter for screening, unspecified: Secondary | ICD-10-CM | POA: Diagnosis not present

## 2022-07-03 DIAGNOSIS — Z136 Encounter for screening for cardiovascular disorders: Secondary | ICD-10-CM | POA: Diagnosis not present

## 2022-07-23 DIAGNOSIS — Z955 Presence of coronary angioplasty implant and graft: Secondary | ICD-10-CM | POA: Diagnosis not present

## 2022-07-23 DIAGNOSIS — I2119 ST elevation (STEMI) myocardial infarction involving other coronary artery of inferior wall: Secondary | ICD-10-CM | POA: Diagnosis not present

## 2022-07-25 ENCOUNTER — Telehealth: Payer: Self-pay

## 2022-07-25 DIAGNOSIS — Z596 Low income: Secondary | ICD-10-CM

## 2022-07-25 NOTE — Progress Notes (Signed)
[facility])                                            [place] Team    07/25/2022  Jose Grant 1945/09/09 [id number]  Care Coordination call placed to [place] in regards to trulicity.   Spoke with [name] who informs patient has been approved 1/12024-07/22/2023. A 120 day supply of medications will be shipped to the patients home. Patient is aware of the approval and informed he will have to call his refills in.  Lelon Huh, [place] [facility] [phone]

## 2022-09-02 DIAGNOSIS — E1159 Type 2 diabetes mellitus with other circulatory complications: Secondary | ICD-10-CM | POA: Diagnosis not present

## 2022-09-02 DIAGNOSIS — E1151 Type 2 diabetes mellitus with diabetic peripheral angiopathy without gangrene: Secondary | ICD-10-CM | POA: Diagnosis not present

## 2022-09-02 DIAGNOSIS — E1169 Type 2 diabetes mellitus with other specified complication: Secondary | ICD-10-CM | POA: Diagnosis not present

## 2022-09-09 DIAGNOSIS — E785 Hyperlipidemia, unspecified: Secondary | ICD-10-CM | POA: Diagnosis not present

## 2022-09-09 DIAGNOSIS — I2510 Atherosclerotic heart disease of native coronary artery without angina pectoris: Secondary | ICD-10-CM | POA: Diagnosis not present

## 2022-09-09 DIAGNOSIS — E1159 Type 2 diabetes mellitus with other circulatory complications: Secondary | ICD-10-CM | POA: Diagnosis not present

## 2022-09-09 DIAGNOSIS — I152 Hypertension secondary to endocrine disorders: Secondary | ICD-10-CM | POA: Diagnosis not present

## 2022-09-09 DIAGNOSIS — E1151 Type 2 diabetes mellitus with diabetic peripheral angiopathy without gangrene: Secondary | ICD-10-CM | POA: Diagnosis not present

## 2022-09-09 DIAGNOSIS — N1831 Chronic kidney disease, stage 3a: Secondary | ICD-10-CM | POA: Diagnosis not present

## 2022-09-09 DIAGNOSIS — Z139 Encounter for screening, unspecified: Secondary | ICD-10-CM | POA: Diagnosis not present

## 2023-01-02 DIAGNOSIS — E1169 Type 2 diabetes mellitus with other specified complication: Secondary | ICD-10-CM | POA: Diagnosis not present

## 2023-01-02 DIAGNOSIS — E1159 Type 2 diabetes mellitus with other circulatory complications: Secondary | ICD-10-CM | POA: Diagnosis not present

## 2023-01-02 DIAGNOSIS — E1151 Type 2 diabetes mellitus with diabetic peripheral angiopathy without gangrene: Secondary | ICD-10-CM | POA: Diagnosis not present

## 2023-01-06 DIAGNOSIS — E1159 Type 2 diabetes mellitus with other circulatory complications: Secondary | ICD-10-CM | POA: Diagnosis not present

## 2023-01-06 DIAGNOSIS — I152 Hypertension secondary to endocrine disorders: Secondary | ICD-10-CM | POA: Diagnosis not present

## 2023-01-06 DIAGNOSIS — E785 Hyperlipidemia, unspecified: Secondary | ICD-10-CM | POA: Diagnosis not present

## 2023-01-06 DIAGNOSIS — E1169 Type 2 diabetes mellitus with other specified complication: Secondary | ICD-10-CM | POA: Diagnosis not present

## 2023-01-20 ENCOUNTER — Encounter (INDEPENDENT_AMBULATORY_CARE_PROVIDER_SITE_OTHER): Payer: Medicare Other | Admitting: Ophthalmology

## 2023-01-20 DIAGNOSIS — H33301 Unspecified retinal break, right eye: Secondary | ICD-10-CM

## 2023-01-20 DIAGNOSIS — E113293 Type 2 diabetes mellitus with mild nonproliferative diabetic retinopathy without macular edema, bilateral: Secondary | ICD-10-CM

## 2023-01-20 DIAGNOSIS — H338 Other retinal detachments: Secondary | ICD-10-CM

## 2023-01-20 DIAGNOSIS — I10 Essential (primary) hypertension: Secondary | ICD-10-CM

## 2023-01-20 DIAGNOSIS — H43813 Vitreous degeneration, bilateral: Secondary | ICD-10-CM | POA: Diagnosis not present

## 2023-01-20 DIAGNOSIS — H35033 Hypertensive retinopathy, bilateral: Secondary | ICD-10-CM | POA: Diagnosis not present

## 2023-01-20 DIAGNOSIS — Z7984 Long term (current) use of oral hypoglycemic drugs: Secondary | ICD-10-CM

## 2023-02-19 DIAGNOSIS — H43813 Vitreous degeneration, bilateral: Secondary | ICD-10-CM | POA: Diagnosis not present

## 2023-02-19 DIAGNOSIS — E119 Type 2 diabetes mellitus without complications: Secondary | ICD-10-CM | POA: Diagnosis not present

## 2023-03-10 DIAGNOSIS — Z7689 Persons encountering health services in other specified circumstances: Secondary | ICD-10-CM | POA: Diagnosis not present

## 2023-03-10 DIAGNOSIS — I259 Chronic ischemic heart disease, unspecified: Secondary | ICD-10-CM | POA: Diagnosis not present

## 2023-03-10 DIAGNOSIS — E119 Type 2 diabetes mellitus without complications: Secondary | ICD-10-CM | POA: Diagnosis not present

## 2023-03-10 DIAGNOSIS — Z7985 Long-term (current) use of injectable non-insulin antidiabetic drugs: Secondary | ICD-10-CM | POA: Diagnosis not present

## 2023-03-10 DIAGNOSIS — I119 Hypertensive heart disease without heart failure: Secondary | ICD-10-CM | POA: Diagnosis not present

## 2023-03-30 DIAGNOSIS — Z743 Need for continuous supervision: Secondary | ICD-10-CM | POA: Diagnosis not present

## 2023-03-30 DIAGNOSIS — E782 Mixed hyperlipidemia: Secondary | ICD-10-CM | POA: Diagnosis not present

## 2023-03-30 DIAGNOSIS — I2510 Atherosclerotic heart disease of native coronary artery without angina pectoris: Secondary | ICD-10-CM | POA: Diagnosis not present

## 2023-03-30 DIAGNOSIS — I252 Old myocardial infarction: Secondary | ICD-10-CM | POA: Diagnosis not present

## 2023-03-30 DIAGNOSIS — Z89022 Acquired absence of left finger(s): Secondary | ICD-10-CM | POA: Diagnosis not present

## 2023-03-30 DIAGNOSIS — Z794 Long term (current) use of insulin: Secondary | ICD-10-CM | POA: Diagnosis not present

## 2023-03-30 DIAGNOSIS — D509 Iron deficiency anemia, unspecified: Secondary | ICD-10-CM | POA: Diagnosis not present

## 2023-03-30 DIAGNOSIS — Z7982 Long term (current) use of aspirin: Secondary | ICD-10-CM | POA: Diagnosis not present

## 2023-03-30 DIAGNOSIS — M1612 Unilateral primary osteoarthritis, left hip: Secondary | ICD-10-CM | POA: Diagnosis not present

## 2023-03-30 DIAGNOSIS — Z7985 Long-term (current) use of injectable non-insulin antidiabetic drugs: Secondary | ICD-10-CM | POA: Diagnosis not present

## 2023-03-30 DIAGNOSIS — H9190 Unspecified hearing loss, unspecified ear: Secondary | ICD-10-CM | POA: Diagnosis not present

## 2023-03-30 DIAGNOSIS — Z87891 Personal history of nicotine dependence: Secondary | ICD-10-CM | POA: Diagnosis not present

## 2023-03-30 DIAGNOSIS — M869 Osteomyelitis, unspecified: Secondary | ICD-10-CM | POA: Diagnosis not present

## 2023-03-30 DIAGNOSIS — Z79899 Other long term (current) drug therapy: Secondary | ICD-10-CM | POA: Diagnosis not present

## 2023-03-30 DIAGNOSIS — S72142A Displaced intertrochanteric fracture of left femur, initial encounter for closed fracture: Secondary | ICD-10-CM | POA: Diagnosis not present

## 2023-03-30 DIAGNOSIS — S72145D Nondisplaced intertrochanteric fracture of left femur, subsequent encounter for closed fracture with routine healing: Secondary | ICD-10-CM | POA: Diagnosis not present

## 2023-03-30 DIAGNOSIS — Z7984 Long term (current) use of oral hypoglycemic drugs: Secondary | ICD-10-CM | POA: Diagnosis not present

## 2023-03-30 DIAGNOSIS — S7292XD Unspecified fracture of left femur, subsequent encounter for closed fracture with routine healing: Secondary | ICD-10-CM | POA: Diagnosis not present

## 2023-03-30 DIAGNOSIS — I444 Left anterior fascicular block: Secondary | ICD-10-CM | POA: Diagnosis not present

## 2023-03-30 DIAGNOSIS — Z01818 Encounter for other preprocedural examination: Secondary | ICD-10-CM | POA: Diagnosis not present

## 2023-03-30 DIAGNOSIS — E119 Type 2 diabetes mellitus without complications: Secondary | ICD-10-CM | POA: Diagnosis not present

## 2023-03-30 DIAGNOSIS — I10 Essential (primary) hypertension: Secondary | ICD-10-CM | POA: Diagnosis not present

## 2023-03-30 DIAGNOSIS — S72002A Fracture of unspecified part of neck of left femur, initial encounter for closed fracture: Secondary | ICD-10-CM | POA: Diagnosis not present

## 2023-03-30 DIAGNOSIS — W19XXXA Unspecified fall, initial encounter: Secondary | ICD-10-CM | POA: Diagnosis not present

## 2023-03-30 DIAGNOSIS — R531 Weakness: Secondary | ICD-10-CM | POA: Diagnosis not present

## 2023-03-30 DIAGNOSIS — S72145A Nondisplaced intertrochanteric fracture of left femur, initial encounter for closed fracture: Secondary | ICD-10-CM | POA: Diagnosis not present

## 2023-03-30 DIAGNOSIS — I440 Atrioventricular block, first degree: Secondary | ICD-10-CM | POA: Diagnosis not present

## 2023-04-03 DIAGNOSIS — I259 Chronic ischemic heart disease, unspecified: Secondary | ICD-10-CM | POA: Diagnosis not present

## 2023-04-03 DIAGNOSIS — E119 Type 2 diabetes mellitus without complications: Secondary | ICD-10-CM | POA: Diagnosis not present

## 2023-04-03 DIAGNOSIS — E782 Mixed hyperlipidemia: Secondary | ICD-10-CM | POA: Diagnosis not present

## 2023-04-03 DIAGNOSIS — I10 Essential (primary) hypertension: Secondary | ICD-10-CM | POA: Diagnosis not present

## 2023-04-03 DIAGNOSIS — S72002A Fracture of unspecified part of neck of left femur, initial encounter for closed fracture: Secondary | ICD-10-CM | POA: Diagnosis not present

## 2023-04-03 DIAGNOSIS — I2510 Atherosclerotic heart disease of native coronary artery without angina pectoris: Secondary | ICD-10-CM | POA: Diagnosis not present

## 2023-04-03 DIAGNOSIS — S72145D Nondisplaced intertrochanteric fracture of left femur, subsequent encounter for closed fracture with routine healing: Secondary | ICD-10-CM | POA: Diagnosis not present

## 2023-04-03 DIAGNOSIS — Z89022 Acquired absence of left finger(s): Secondary | ICD-10-CM | POA: Diagnosis not present

## 2023-04-03 DIAGNOSIS — Z743 Need for continuous supervision: Secondary | ICD-10-CM | POA: Diagnosis not present

## 2023-04-03 DIAGNOSIS — R531 Weakness: Secondary | ICD-10-CM | POA: Diagnosis not present

## 2023-04-03 DIAGNOSIS — D649 Anemia, unspecified: Secondary | ICD-10-CM | POA: Diagnosis not present

## 2023-04-03 DIAGNOSIS — M869 Osteomyelitis, unspecified: Secondary | ICD-10-CM | POA: Diagnosis not present

## 2023-04-03 DIAGNOSIS — J309 Allergic rhinitis, unspecified: Secondary | ICD-10-CM | POA: Diagnosis not present

## 2023-04-03 DIAGNOSIS — I440 Atrioventricular block, first degree: Secondary | ICD-10-CM | POA: Diagnosis not present

## 2023-04-04 DIAGNOSIS — D649 Anemia, unspecified: Secondary | ICD-10-CM | POA: Diagnosis not present

## 2023-04-06 DIAGNOSIS — I10 Essential (primary) hypertension: Secondary | ICD-10-CM | POA: Diagnosis not present

## 2023-04-06 DIAGNOSIS — S72002A Fracture of unspecified part of neck of left femur, initial encounter for closed fracture: Secondary | ICD-10-CM | POA: Diagnosis not present

## 2023-04-06 DIAGNOSIS — I2510 Atherosclerotic heart disease of native coronary artery without angina pectoris: Secondary | ICD-10-CM | POA: Diagnosis not present

## 2023-04-06 DIAGNOSIS — E119 Type 2 diabetes mellitus without complications: Secondary | ICD-10-CM | POA: Diagnosis not present

## 2023-04-09 DIAGNOSIS — I440 Atrioventricular block, first degree: Secondary | ICD-10-CM | POA: Diagnosis not present

## 2023-04-09 DIAGNOSIS — I10 Essential (primary) hypertension: Secondary | ICD-10-CM | POA: Diagnosis not present

## 2023-04-09 DIAGNOSIS — E119 Type 2 diabetes mellitus without complications: Secondary | ICD-10-CM | POA: Diagnosis not present

## 2023-04-09 DIAGNOSIS — J309 Allergic rhinitis, unspecified: Secondary | ICD-10-CM | POA: Diagnosis not present

## 2023-04-09 DIAGNOSIS — S72002A Fracture of unspecified part of neck of left femur, initial encounter for closed fracture: Secondary | ICD-10-CM | POA: Diagnosis not present

## 2023-04-09 DIAGNOSIS — I259 Chronic ischemic heart disease, unspecified: Secondary | ICD-10-CM | POA: Diagnosis not present

## 2023-04-09 DIAGNOSIS — I2510 Atherosclerotic heart disease of native coronary artery without angina pectoris: Secondary | ICD-10-CM | POA: Diagnosis not present

## 2023-04-09 DIAGNOSIS — E782 Mixed hyperlipidemia: Secondary | ICD-10-CM | POA: Diagnosis not present

## 2023-04-21 DIAGNOSIS — S72002A Fracture of unspecified part of neck of left femur, initial encounter for closed fracture: Secondary | ICD-10-CM | POA: Diagnosis not present

## 2023-04-23 DIAGNOSIS — I2510 Atherosclerotic heart disease of native coronary artery without angina pectoris: Secondary | ICD-10-CM | POA: Diagnosis not present

## 2023-04-23 DIAGNOSIS — I10 Essential (primary) hypertension: Secondary | ICD-10-CM | POA: Diagnosis not present

## 2023-04-23 DIAGNOSIS — I440 Atrioventricular block, first degree: Secondary | ICD-10-CM | POA: Diagnosis not present

## 2023-04-23 DIAGNOSIS — S72002A Fracture of unspecified part of neck of left femur, initial encounter for closed fracture: Secondary | ICD-10-CM | POA: Diagnosis not present

## 2023-04-23 DIAGNOSIS — E782 Mixed hyperlipidemia: Secondary | ICD-10-CM | POA: Diagnosis not present

## 2023-04-23 DIAGNOSIS — I259 Chronic ischemic heart disease, unspecified: Secondary | ICD-10-CM | POA: Diagnosis not present

## 2023-04-23 DIAGNOSIS — J309 Allergic rhinitis, unspecified: Secondary | ICD-10-CM | POA: Diagnosis not present

## 2023-04-26 DIAGNOSIS — E782 Mixed hyperlipidemia: Secondary | ICD-10-CM | POA: Diagnosis not present

## 2023-04-26 DIAGNOSIS — Z7984 Long term (current) use of oral hypoglycemic drugs: Secondary | ICD-10-CM | POA: Diagnosis not present

## 2023-04-26 DIAGNOSIS — E119 Type 2 diabetes mellitus without complications: Secondary | ICD-10-CM | POA: Diagnosis not present

## 2023-04-26 DIAGNOSIS — J309 Allergic rhinitis, unspecified: Secondary | ICD-10-CM | POA: Diagnosis not present

## 2023-04-26 DIAGNOSIS — I440 Atrioventricular block, first degree: Secondary | ICD-10-CM | POA: Diagnosis not present

## 2023-04-26 DIAGNOSIS — I2510 Atherosclerotic heart disease of native coronary artery without angina pectoris: Secondary | ICD-10-CM | POA: Diagnosis not present

## 2023-04-26 DIAGNOSIS — Z7982 Long term (current) use of aspirin: Secondary | ICD-10-CM | POA: Diagnosis not present

## 2023-04-26 DIAGNOSIS — S72145D Nondisplaced intertrochanteric fracture of left femur, subsequent encounter for closed fracture with routine healing: Secondary | ICD-10-CM | POA: Diagnosis not present

## 2023-04-26 DIAGNOSIS — Z9181 History of falling: Secondary | ICD-10-CM | POA: Diagnosis not present

## 2023-04-26 DIAGNOSIS — I252 Old myocardial infarction: Secondary | ICD-10-CM | POA: Diagnosis not present

## 2023-04-26 DIAGNOSIS — I10 Essential (primary) hypertension: Secondary | ICD-10-CM | POA: Diagnosis not present

## 2023-04-26 DIAGNOSIS — Z7985 Long-term (current) use of injectable non-insulin antidiabetic drugs: Secondary | ICD-10-CM | POA: Diagnosis not present

## 2023-04-26 DIAGNOSIS — Z87891 Personal history of nicotine dependence: Secondary | ICD-10-CM | POA: Diagnosis not present

## 2023-04-30 DIAGNOSIS — I2510 Atherosclerotic heart disease of native coronary artery without angina pectoris: Secondary | ICD-10-CM | POA: Diagnosis not present

## 2023-04-30 DIAGNOSIS — I440 Atrioventricular block, first degree: Secondary | ICD-10-CM | POA: Diagnosis not present

## 2023-04-30 DIAGNOSIS — Z87891 Personal history of nicotine dependence: Secondary | ICD-10-CM | POA: Diagnosis not present

## 2023-04-30 DIAGNOSIS — Z7984 Long term (current) use of oral hypoglycemic drugs: Secondary | ICD-10-CM | POA: Diagnosis not present

## 2023-04-30 DIAGNOSIS — S72145D Nondisplaced intertrochanteric fracture of left femur, subsequent encounter for closed fracture with routine healing: Secondary | ICD-10-CM | POA: Diagnosis not present

## 2023-04-30 DIAGNOSIS — E782 Mixed hyperlipidemia: Secondary | ICD-10-CM | POA: Diagnosis not present

## 2023-04-30 DIAGNOSIS — Z9181 History of falling: Secondary | ICD-10-CM | POA: Diagnosis not present

## 2023-04-30 DIAGNOSIS — Z7982 Long term (current) use of aspirin: Secondary | ICD-10-CM | POA: Diagnosis not present

## 2023-04-30 DIAGNOSIS — I10 Essential (primary) hypertension: Secondary | ICD-10-CM | POA: Diagnosis not present

## 2023-04-30 DIAGNOSIS — Z7985 Long-term (current) use of injectable non-insulin antidiabetic drugs: Secondary | ICD-10-CM | POA: Diagnosis not present

## 2023-04-30 DIAGNOSIS — E119 Type 2 diabetes mellitus without complications: Secondary | ICD-10-CM | POA: Diagnosis not present

## 2023-04-30 DIAGNOSIS — J309 Allergic rhinitis, unspecified: Secondary | ICD-10-CM | POA: Diagnosis not present

## 2023-04-30 DIAGNOSIS — I252 Old myocardial infarction: Secondary | ICD-10-CM | POA: Diagnosis not present

## 2023-05-02 DIAGNOSIS — Z7982 Long term (current) use of aspirin: Secondary | ICD-10-CM | POA: Diagnosis not present

## 2023-05-02 DIAGNOSIS — I10 Essential (primary) hypertension: Secondary | ICD-10-CM | POA: Diagnosis not present

## 2023-05-02 DIAGNOSIS — J309 Allergic rhinitis, unspecified: Secondary | ICD-10-CM | POA: Diagnosis not present

## 2023-05-02 DIAGNOSIS — Z7984 Long term (current) use of oral hypoglycemic drugs: Secondary | ICD-10-CM | POA: Diagnosis not present

## 2023-05-02 DIAGNOSIS — E119 Type 2 diabetes mellitus without complications: Secondary | ICD-10-CM | POA: Diagnosis not present

## 2023-05-02 DIAGNOSIS — Z9181 History of falling: Secondary | ICD-10-CM | POA: Diagnosis not present

## 2023-05-02 DIAGNOSIS — I252 Old myocardial infarction: Secondary | ICD-10-CM | POA: Diagnosis not present

## 2023-05-02 DIAGNOSIS — I440 Atrioventricular block, first degree: Secondary | ICD-10-CM | POA: Diagnosis not present

## 2023-05-02 DIAGNOSIS — I2510 Atherosclerotic heart disease of native coronary artery without angina pectoris: Secondary | ICD-10-CM | POA: Diagnosis not present

## 2023-05-02 DIAGNOSIS — E782 Mixed hyperlipidemia: Secondary | ICD-10-CM | POA: Diagnosis not present

## 2023-05-02 DIAGNOSIS — S72145D Nondisplaced intertrochanteric fracture of left femur, subsequent encounter for closed fracture with routine healing: Secondary | ICD-10-CM | POA: Diagnosis not present

## 2023-05-02 DIAGNOSIS — Z87891 Personal history of nicotine dependence: Secondary | ICD-10-CM | POA: Diagnosis not present

## 2023-05-02 DIAGNOSIS — Z7985 Long-term (current) use of injectable non-insulin antidiabetic drugs: Secondary | ICD-10-CM | POA: Diagnosis not present

## 2023-05-05 DIAGNOSIS — I252 Old myocardial infarction: Secondary | ICD-10-CM | POA: Diagnosis not present

## 2023-05-05 DIAGNOSIS — I10 Essential (primary) hypertension: Secondary | ICD-10-CM | POA: Diagnosis not present

## 2023-05-05 DIAGNOSIS — S72145D Nondisplaced intertrochanteric fracture of left femur, subsequent encounter for closed fracture with routine healing: Secondary | ICD-10-CM | POA: Diagnosis not present

## 2023-05-05 DIAGNOSIS — Z7982 Long term (current) use of aspirin: Secondary | ICD-10-CM | POA: Diagnosis not present

## 2023-05-05 DIAGNOSIS — Z7984 Long term (current) use of oral hypoglycemic drugs: Secondary | ICD-10-CM | POA: Diagnosis not present

## 2023-05-05 DIAGNOSIS — J309 Allergic rhinitis, unspecified: Secondary | ICD-10-CM | POA: Diagnosis not present

## 2023-05-05 DIAGNOSIS — Z7985 Long-term (current) use of injectable non-insulin antidiabetic drugs: Secondary | ICD-10-CM | POA: Diagnosis not present

## 2023-05-05 DIAGNOSIS — E119 Type 2 diabetes mellitus without complications: Secondary | ICD-10-CM | POA: Diagnosis not present

## 2023-05-05 DIAGNOSIS — I440 Atrioventricular block, first degree: Secondary | ICD-10-CM | POA: Diagnosis not present

## 2023-05-05 DIAGNOSIS — Z87891 Personal history of nicotine dependence: Secondary | ICD-10-CM | POA: Diagnosis not present

## 2023-05-05 DIAGNOSIS — Z9181 History of falling: Secondary | ICD-10-CM | POA: Diagnosis not present

## 2023-05-05 DIAGNOSIS — I2510 Atherosclerotic heart disease of native coronary artery without angina pectoris: Secondary | ICD-10-CM | POA: Diagnosis not present

## 2023-05-05 DIAGNOSIS — E782 Mixed hyperlipidemia: Secondary | ICD-10-CM | POA: Diagnosis not present

## 2023-05-06 DIAGNOSIS — Z7982 Long term (current) use of aspirin: Secondary | ICD-10-CM | POA: Diagnosis not present

## 2023-05-06 DIAGNOSIS — I252 Old myocardial infarction: Secondary | ICD-10-CM | POA: Diagnosis not present

## 2023-05-06 DIAGNOSIS — I10 Essential (primary) hypertension: Secondary | ICD-10-CM | POA: Diagnosis not present

## 2023-05-06 DIAGNOSIS — Z7985 Long-term (current) use of injectable non-insulin antidiabetic drugs: Secondary | ICD-10-CM | POA: Diagnosis not present

## 2023-05-06 DIAGNOSIS — Z9181 History of falling: Secondary | ICD-10-CM | POA: Diagnosis not present

## 2023-05-06 DIAGNOSIS — Z7984 Long term (current) use of oral hypoglycemic drugs: Secondary | ICD-10-CM | POA: Diagnosis not present

## 2023-05-06 DIAGNOSIS — I440 Atrioventricular block, first degree: Secondary | ICD-10-CM | POA: Diagnosis not present

## 2023-05-06 DIAGNOSIS — E119 Type 2 diabetes mellitus without complications: Secondary | ICD-10-CM | POA: Diagnosis not present

## 2023-05-06 DIAGNOSIS — J309 Allergic rhinitis, unspecified: Secondary | ICD-10-CM | POA: Diagnosis not present

## 2023-05-06 DIAGNOSIS — I2510 Atherosclerotic heart disease of native coronary artery without angina pectoris: Secondary | ICD-10-CM | POA: Diagnosis not present

## 2023-05-06 DIAGNOSIS — Z87891 Personal history of nicotine dependence: Secondary | ICD-10-CM | POA: Diagnosis not present

## 2023-05-06 DIAGNOSIS — E782 Mixed hyperlipidemia: Secondary | ICD-10-CM | POA: Diagnosis not present

## 2023-05-06 DIAGNOSIS — S72145D Nondisplaced intertrochanteric fracture of left femur, subsequent encounter for closed fracture with routine healing: Secondary | ICD-10-CM | POA: Diagnosis not present

## 2023-05-07 DIAGNOSIS — I259 Chronic ischemic heart disease, unspecified: Secondary | ICD-10-CM | POA: Diagnosis not present

## 2023-05-07 DIAGNOSIS — E119 Type 2 diabetes mellitus without complications: Secondary | ICD-10-CM | POA: Diagnosis not present

## 2023-05-07 DIAGNOSIS — I10 Essential (primary) hypertension: Secondary | ICD-10-CM | POA: Diagnosis not present

## 2023-05-07 DIAGNOSIS — R350 Frequency of micturition: Secondary | ICD-10-CM | POA: Diagnosis not present

## 2023-05-07 DIAGNOSIS — S72002A Fracture of unspecified part of neck of left femur, initial encounter for closed fracture: Secondary | ICD-10-CM | POA: Diagnosis not present

## 2023-05-09 DIAGNOSIS — Z7982 Long term (current) use of aspirin: Secondary | ICD-10-CM | POA: Diagnosis not present

## 2023-05-09 DIAGNOSIS — Z7984 Long term (current) use of oral hypoglycemic drugs: Secondary | ICD-10-CM | POA: Diagnosis not present

## 2023-05-09 DIAGNOSIS — Z87891 Personal history of nicotine dependence: Secondary | ICD-10-CM | POA: Diagnosis not present

## 2023-05-09 DIAGNOSIS — J309 Allergic rhinitis, unspecified: Secondary | ICD-10-CM | POA: Diagnosis not present

## 2023-05-09 DIAGNOSIS — Z7985 Long-term (current) use of injectable non-insulin antidiabetic drugs: Secondary | ICD-10-CM | POA: Diagnosis not present

## 2023-05-09 DIAGNOSIS — Z9181 History of falling: Secondary | ICD-10-CM | POA: Diagnosis not present

## 2023-05-09 DIAGNOSIS — I440 Atrioventricular block, first degree: Secondary | ICD-10-CM | POA: Diagnosis not present

## 2023-05-09 DIAGNOSIS — I2510 Atherosclerotic heart disease of native coronary artery without angina pectoris: Secondary | ICD-10-CM | POA: Diagnosis not present

## 2023-05-09 DIAGNOSIS — I252 Old myocardial infarction: Secondary | ICD-10-CM | POA: Diagnosis not present

## 2023-05-09 DIAGNOSIS — I10 Essential (primary) hypertension: Secondary | ICD-10-CM | POA: Diagnosis not present

## 2023-05-09 DIAGNOSIS — S72145D Nondisplaced intertrochanteric fracture of left femur, subsequent encounter for closed fracture with routine healing: Secondary | ICD-10-CM | POA: Diagnosis not present

## 2023-05-09 DIAGNOSIS — E119 Type 2 diabetes mellitus without complications: Secondary | ICD-10-CM | POA: Diagnosis not present

## 2023-05-09 DIAGNOSIS — E782 Mixed hyperlipidemia: Secondary | ICD-10-CM | POA: Diagnosis not present

## 2023-05-12 DIAGNOSIS — S72145D Nondisplaced intertrochanteric fracture of left femur, subsequent encounter for closed fracture with routine healing: Secondary | ICD-10-CM | POA: Diagnosis not present

## 2023-05-12 DIAGNOSIS — E119 Type 2 diabetes mellitus without complications: Secondary | ICD-10-CM | POA: Diagnosis not present

## 2023-05-12 DIAGNOSIS — I252 Old myocardial infarction: Secondary | ICD-10-CM | POA: Diagnosis not present

## 2023-05-12 DIAGNOSIS — I440 Atrioventricular block, first degree: Secondary | ICD-10-CM | POA: Diagnosis not present

## 2023-05-12 DIAGNOSIS — Z87891 Personal history of nicotine dependence: Secondary | ICD-10-CM | POA: Diagnosis not present

## 2023-05-12 DIAGNOSIS — I2510 Atherosclerotic heart disease of native coronary artery without angina pectoris: Secondary | ICD-10-CM | POA: Diagnosis not present

## 2023-05-12 DIAGNOSIS — Z7984 Long term (current) use of oral hypoglycemic drugs: Secondary | ICD-10-CM | POA: Diagnosis not present

## 2023-05-12 DIAGNOSIS — Z7985 Long-term (current) use of injectable non-insulin antidiabetic drugs: Secondary | ICD-10-CM | POA: Diagnosis not present

## 2023-05-12 DIAGNOSIS — E782 Mixed hyperlipidemia: Secondary | ICD-10-CM | POA: Diagnosis not present

## 2023-05-12 DIAGNOSIS — J309 Allergic rhinitis, unspecified: Secondary | ICD-10-CM | POA: Diagnosis not present

## 2023-05-12 DIAGNOSIS — Z9181 History of falling: Secondary | ICD-10-CM | POA: Diagnosis not present

## 2023-05-12 DIAGNOSIS — I10 Essential (primary) hypertension: Secondary | ICD-10-CM | POA: Diagnosis not present

## 2023-05-12 DIAGNOSIS — Z7982 Long term (current) use of aspirin: Secondary | ICD-10-CM | POA: Diagnosis not present

## 2023-05-16 DIAGNOSIS — I252 Old myocardial infarction: Secondary | ICD-10-CM | POA: Diagnosis not present

## 2023-05-16 DIAGNOSIS — Z7985 Long-term (current) use of injectable non-insulin antidiabetic drugs: Secondary | ICD-10-CM | POA: Diagnosis not present

## 2023-05-16 DIAGNOSIS — E119 Type 2 diabetes mellitus without complications: Secondary | ICD-10-CM | POA: Diagnosis not present

## 2023-05-16 DIAGNOSIS — Z7982 Long term (current) use of aspirin: Secondary | ICD-10-CM | POA: Diagnosis not present

## 2023-05-16 DIAGNOSIS — I2510 Atherosclerotic heart disease of native coronary artery without angina pectoris: Secondary | ICD-10-CM | POA: Diagnosis not present

## 2023-05-16 DIAGNOSIS — S72145D Nondisplaced intertrochanteric fracture of left femur, subsequent encounter for closed fracture with routine healing: Secondary | ICD-10-CM | POA: Diagnosis not present

## 2023-05-16 DIAGNOSIS — E782 Mixed hyperlipidemia: Secondary | ICD-10-CM | POA: Diagnosis not present

## 2023-05-16 DIAGNOSIS — Z87891 Personal history of nicotine dependence: Secondary | ICD-10-CM | POA: Diagnosis not present

## 2023-05-16 DIAGNOSIS — I10 Essential (primary) hypertension: Secondary | ICD-10-CM | POA: Diagnosis not present

## 2023-05-16 DIAGNOSIS — I440 Atrioventricular block, first degree: Secondary | ICD-10-CM | POA: Diagnosis not present

## 2023-05-16 DIAGNOSIS — J309 Allergic rhinitis, unspecified: Secondary | ICD-10-CM | POA: Diagnosis not present

## 2023-05-16 DIAGNOSIS — Z7984 Long term (current) use of oral hypoglycemic drugs: Secondary | ICD-10-CM | POA: Diagnosis not present

## 2023-05-16 DIAGNOSIS — Z9181 History of falling: Secondary | ICD-10-CM | POA: Diagnosis not present

## 2023-05-21 DIAGNOSIS — E119 Type 2 diabetes mellitus without complications: Secondary | ICD-10-CM | POA: Diagnosis not present

## 2023-05-21 DIAGNOSIS — I440 Atrioventricular block, first degree: Secondary | ICD-10-CM | POA: Diagnosis not present

## 2023-05-21 DIAGNOSIS — I10 Essential (primary) hypertension: Secondary | ICD-10-CM | POA: Diagnosis not present

## 2023-05-21 DIAGNOSIS — Z7982 Long term (current) use of aspirin: Secondary | ICD-10-CM | POA: Diagnosis not present

## 2023-05-21 DIAGNOSIS — I252 Old myocardial infarction: Secondary | ICD-10-CM | POA: Diagnosis not present

## 2023-05-21 DIAGNOSIS — S72145D Nondisplaced intertrochanteric fracture of left femur, subsequent encounter for closed fracture with routine healing: Secondary | ICD-10-CM | POA: Diagnosis not present

## 2023-05-21 DIAGNOSIS — J309 Allergic rhinitis, unspecified: Secondary | ICD-10-CM | POA: Diagnosis not present

## 2023-05-21 DIAGNOSIS — Z9181 History of falling: Secondary | ICD-10-CM | POA: Diagnosis not present

## 2023-05-21 DIAGNOSIS — Z7984 Long term (current) use of oral hypoglycemic drugs: Secondary | ICD-10-CM | POA: Diagnosis not present

## 2023-05-21 DIAGNOSIS — Z7985 Long-term (current) use of injectable non-insulin antidiabetic drugs: Secondary | ICD-10-CM | POA: Diagnosis not present

## 2023-05-21 DIAGNOSIS — Z87891 Personal history of nicotine dependence: Secondary | ICD-10-CM | POA: Diagnosis not present

## 2023-05-21 DIAGNOSIS — I2510 Atherosclerotic heart disease of native coronary artery without angina pectoris: Secondary | ICD-10-CM | POA: Diagnosis not present

## 2023-05-21 DIAGNOSIS — E782 Mixed hyperlipidemia: Secondary | ICD-10-CM | POA: Diagnosis not present

## 2023-05-26 DIAGNOSIS — S72002A Fracture of unspecified part of neck of left femur, initial encounter for closed fracture: Secondary | ICD-10-CM | POA: Diagnosis not present

## 2023-07-08 DIAGNOSIS — E782 Mixed hyperlipidemia: Secondary | ICD-10-CM | POA: Diagnosis not present

## 2023-07-08 DIAGNOSIS — E119 Type 2 diabetes mellitus without complications: Secondary | ICD-10-CM | POA: Diagnosis not present

## 2023-07-08 DIAGNOSIS — I10 Essential (primary) hypertension: Secondary | ICD-10-CM | POA: Diagnosis not present

## 2023-07-10 ENCOUNTER — Telehealth: Payer: Self-pay | Admitting: Pharmacy Technician

## 2023-07-10 DIAGNOSIS — I10 Essential (primary) hypertension: Secondary | ICD-10-CM | POA: Diagnosis not present

## 2023-07-10 DIAGNOSIS — E782 Mixed hyperlipidemia: Secondary | ICD-10-CM | POA: Diagnosis not present

## 2023-07-10 DIAGNOSIS — E119 Type 2 diabetes mellitus without complications: Secondary | ICD-10-CM | POA: Diagnosis not present

## 2023-07-10 DIAGNOSIS — Z5986 Financial insecurity: Secondary | ICD-10-CM

## 2023-07-10 DIAGNOSIS — I259 Chronic ischemic heart disease, unspecified: Secondary | ICD-10-CM | POA: Diagnosis not present

## 2023-07-10 NOTE — Progress Notes (Signed)
Pharmacy Medication Assistance Program Note    07/10/2023  Patient ID: Jose Grant, male   DOB: March 20, 1946, 77 y.o.   MRN: [id number]     07/10/2023  Outreach Medication One  Initial Outreach Date (Medication One) 07/10/2023  Manufacturer Medication One [employer] Trulicity  Dose of Trulicity 3mg   Type of Radiographer, therapeutic Assistance  Date Application Sent to Patient 07/10/2023  Application Items Requested Application;Proof of Income;Other  Date Application Sent to Prescriber 07/10/2023  Name of Prescriber Darlis Loan        07/10/2023  Outreach Medication Two  Initial Outreach Date (Medication Two) 07/10/2023  Manufacturer Medication Two [name]  [name] Drugs Farxiga  Dose of Farxiga 10mg   Type of Radiographer, therapeutic Assistance  Date Application Sent to Patient 07/10/2023  Application Items Requested Application;Proof of Income;Other  Date Application Sent to Prescriber 07/10/2023  Name of Prescriber Darlis Loan    Incoming call received from patient. HIPAA verified.  Patient informs his provider, Darlis Loan, has asked that we complete the re enrollment applications on the patient's behalf. Patient informs he is a household of 2 and income meets program requirements. Patient is aware of need of proof of income for household and informs [employer] will be Med D provider in 2025. Applications mailed to patient today and faxed to Dr. Mayford Knife.  Signature  Kristopher Glee [facility]  Office: [phone] Fax: [phone] Email: [email or web address]

## 2023-07-31 ENCOUNTER — Telehealth: Payer: Self-pay | Admitting: Pharmacy Technician

## 2023-07-31 DIAGNOSIS — Z5986 Financial insecurity: Secondary | ICD-10-CM

## 2023-07-31 DIAGNOSIS — I10 Essential (primary) hypertension: Secondary | ICD-10-CM | POA: Diagnosis not present

## 2023-07-31 DIAGNOSIS — Z7982 Long term (current) use of aspirin: Secondary | ICD-10-CM | POA: Diagnosis not present

## 2023-07-31 DIAGNOSIS — I252 Old myocardial infarction: Secondary | ICD-10-CM | POA: Diagnosis not present

## 2023-07-31 DIAGNOSIS — Z955 Presence of coronary angioplasty implant and graft: Secondary | ICD-10-CM | POA: Diagnosis not present

## 2023-07-31 DIAGNOSIS — E785 Hyperlipidemia, unspecified: Secondary | ICD-10-CM | POA: Diagnosis not present

## 2023-07-31 NOTE — Progress Notes (Signed)
 Pharmacy Medication Assistance Program Note    07/31/2023  Patient ID: Jose Grant, male   DOB: [birth year], 78 y.o.   MRN: [id number]     07/10/2023 07/31/2023  Outreach Medication One  Initial Outreach Date (Medication One) 07/10/2023   Manufacturer Medication One [name]   [name] [facility] Trulicity   Dose of Trulicity 3mg    Type of Radiographer, Therapeutic Assistance   Date Application Sent to Patient 07/10/2023   Application Items Requested Application;Proof of Income;Other   Date Application Sent to Prescriber 07/10/2023   Name of Prescriber [name]   Date Application Received From Patient  07/30/2023  Application Items Received From Patient  Application;Proof of Income;Other  Date Application Received From Provider  07/21/2023  Date Application Submitted to Manufacturer  07/30/2023        07/10/2023 07/31/2023  Outreach Medication Two  Initial Outreach Date (Medication Two) 07/10/2023   Manufacturer Medication Two [name]   [name] Drugs Farxiga   Dose of Farxiga 10mg    Type of Radiographer, Therapeutic Assistance   Date Application Sent to Patient 07/10/2023   Application Items Requested Application;Proof of Income;Other   Date Application Sent to Prescriber 07/10/2023   Name of Prescriber [name]   Date Application Received From Patient  07/30/2023  Application Items Received From Patient  Application;Proof of Income;Other  Date Application Received From Provider  07/22/2023  Method Application Sent to Manufacturer  Fax  Date Application Submitted to Manufacturer  07/30/2023     Signature  Kate Marzette [place]  Office: [phone] Fax: [phone] Email: [name]

## 2023-08-11 ENCOUNTER — Telehealth: Payer: Self-pay | Admitting: Pharmacy Technician

## 2023-08-11 DIAGNOSIS — Z5986 Financial insecurity: Secondary | ICD-10-CM

## 2023-08-11 NOTE — Progress Notes (Addendum)
Pharmacy Medication Assistance Program Note    08/15/2023  Patient ID: Jose Grant, male   DOB: 1946/07/07, 78 y.o.   MRN: [id number]     07/10/2023 07/31/2023 08/11/2023  Outreach Medication One  Initial Outreach Date (Medication One) 07/10/2023    Manufacturer Medication One Julious Oka    [name] Trulicity    Dose of Trulicity 3mg     Type of Radiographer, therapeutic Assistance    Date Application Sent to Patient 07/10/2023    Application Items Requested Application;Proof of Income;Other    Date Application Sent to Prescriber 07/10/2023    Name of Prescriber Darlis Loan    Date Application Received From Patient  07/30/2023   Application Items Received From Patient  Application;Proof of Income;Other   Date Application Received From Provider  07/21/2023   Date Application Submitted to Manufacturer  07/30/2023   Patient Assistance Determination   Approved  Approval Start Date   07/31/2023  Approval End Date   07/21/2024  Patient Notification Method   Telephone Call  Telephone Call Outcome   Successful    , male  DOB: 06/30/1946, 78 y.o.  MRN:  [id number]     07/10/2023 07/31/2023 08/11/2023  Outreach Medication Two  Initial Outreach Date (Medication Two) 07/10/2023    Manufacturer Medication Two [name]    [name] Drugs Farxiga    Dose of Farxiga 10mg     Type of Radiographer, therapeutic Assistance    Date Application Sent to Patient 07/10/2023    Application Items Requested Application;Proof of Income;Other    Date Application Sent to Prescriber 07/10/2023    Name of Prescriber Darlis Loan    Date Application Received From Patient  07/30/2023   Application Items Received From Patient  Application;Proof of Income;Other   Date Application Received From Provider  07/22/2023   Method Application Sent to Manufacturer  Fax   Date Application Submitted to Manufacturer  07/30/2023   Patient Assistance Determination   Approved  Approval Start Date   07/31/2023  Patient  Notification Method   Telephone Call  Telephone Call Outcome   Successful   Two care coordination calls placed today to [name] in regard to Trulicity application and to [employer] in regard to [facility] application.  Spoke to [place] at [facility] who informs patient is APPROVED 07/31/23-07/21/24 for [employer]. Per Dewayne Hatch, order was sent over to their pharmacy called [facility] on 07/31/23. She informs patient can call [name] at [phone] or [facility] at [phone] at any time to check on shipments. She informs patient is on auto fill and thus refills and shipments should process automatically.  Per [employer] IVR system, patient is APPROVED 07/31/23-07/21/24. Medication refills should process and ship automatically. Patient may call [employer] at anytime to check on shipments by calling [phone].  Successful outreach to patient. HIPAA verified. Informed patient of his approvals, refill procedures and phone numbers to the programs to call and check on shipments. Patient verbalized understanding.  Pattricia Boss, CPhT [place]  Office: [phone] Fax: [phone] Email: [email or web address]

## 2023-08-27 DIAGNOSIS — Z13220 Encounter for screening for lipoid disorders: Secondary | ICD-10-CM | POA: Diagnosis not present

## 2023-08-27 DIAGNOSIS — I10 Essential (primary) hypertension: Secondary | ICD-10-CM | POA: Diagnosis not present

## 2023-09-10 DIAGNOSIS — I10 Essential (primary) hypertension: Secondary | ICD-10-CM | POA: Diagnosis not present

## 2023-11-28 DIAGNOSIS — Z13220 Encounter for screening for lipoid disorders: Secondary | ICD-10-CM | POA: Diagnosis not present

## 2023-11-28 DIAGNOSIS — I10 Essential (primary) hypertension: Secondary | ICD-10-CM | POA: Diagnosis not present

## 2023-12-03 DIAGNOSIS — E782 Mixed hyperlipidemia: Secondary | ICD-10-CM | POA: Diagnosis not present

## 2023-12-03 DIAGNOSIS — E119 Type 2 diabetes mellitus without complications: Secondary | ICD-10-CM | POA: Diagnosis not present

## 2023-12-03 DIAGNOSIS — N1831 Chronic kidney disease, stage 3a: Secondary | ICD-10-CM | POA: Diagnosis not present

## 2023-12-03 DIAGNOSIS — I129 Hypertensive chronic kidney disease with stage 1 through stage 4 chronic kidney disease, or unspecified chronic kidney disease: Secondary | ICD-10-CM | POA: Diagnosis not present

## 2023-12-03 DIAGNOSIS — I2510 Atherosclerotic heart disease of native coronary artery without angina pectoris: Secondary | ICD-10-CM | POA: Diagnosis not present

## 2023-12-03 DIAGNOSIS — M47817 Spondylosis without myelopathy or radiculopathy, lumbosacral region: Secondary | ICD-10-CM | POA: Diagnosis not present

## 2023-12-03 DIAGNOSIS — Z0000 Encounter for general adult medical examination without abnormal findings: Secondary | ICD-10-CM | POA: Diagnosis not present

## 2024-01-19 ENCOUNTER — Encounter (INDEPENDENT_AMBULATORY_CARE_PROVIDER_SITE_OTHER): Payer: Medicare Other | Admitting: Ophthalmology

## 2024-01-19 DIAGNOSIS — H33301 Unspecified retinal break, right eye: Secondary | ICD-10-CM | POA: Diagnosis not present

## 2024-01-19 DIAGNOSIS — H43813 Vitreous degeneration, bilateral: Secondary | ICD-10-CM | POA: Diagnosis not present

## 2024-01-19 DIAGNOSIS — Z7984 Long term (current) use of oral hypoglycemic drugs: Secondary | ICD-10-CM | POA: Diagnosis not present

## 2024-01-19 DIAGNOSIS — E113293 Type 2 diabetes mellitus with mild nonproliferative diabetic retinopathy without macular edema, bilateral: Secondary | ICD-10-CM | POA: Diagnosis not present

## 2024-01-19 DIAGNOSIS — H35033 Hypertensive retinopathy, bilateral: Secondary | ICD-10-CM | POA: Diagnosis not present

## 2024-01-19 DIAGNOSIS — I10 Essential (primary) hypertension: Secondary | ICD-10-CM

## 2024-01-19 DIAGNOSIS — H338 Other retinal detachments: Secondary | ICD-10-CM

## 2024-01-26 ENCOUNTER — Telehealth: Payer: Self-pay | Admitting: Pharmacy Technician

## 2024-01-26 DIAGNOSIS — Z5986 Financial insecurity: Secondary | ICD-10-CM

## 2024-01-26 NOTE — Progress Notes (Signed)
   01/26/2024  Patient ID: Jose Grant, male   DOB: Sep 04, 1945, 78 y.o.   MRN: [id number]  Patient left voicemail informing he will be out of town at next Trulicity delivery from [employer].  [employer] and representative informs refill can be delivered tomorrow 01/27/2024.  Called patient back and informed him of this information. Patient appreciated the call.  Nandini Bogdanski, CPhT [facility] Office: [phone] Email: [email or web address]

## 2024-08-03 ENCOUNTER — Telehealth: Payer: Self-pay | Admitting: Pharmacist

## 2024-08-03 NOTE — Progress Notes (Signed)
 Received message from Jill Simcox, CPhT, that this patient had called to check on status of Trulicity patient assistance re-enrollment.   Contacted patient. Discussed that as he is no longer a patient of [facility] or an attributed patient for [employer], our team would no longer be able to help manage medication access. Provided phone number for [facility] ([phone]) and encouraged him to call and requested an updated application be mailed to him. Encouraged to take to his PCP, Dr. Shlomo, for completion.   Patient verbalized understanding.   Catie IVAR Centers, PharmD, [facility] Clinical Pharmacist [phone]

## 2024-08-19 ENCOUNTER — Telehealth: Payer: Self-pay | Admitting: Pharmacy Technician

## 2024-08-19 NOTE — Progress Notes (Addendum)
" ° °  08/19/2024  Patient ID: Jose Grant, male   DOB: [birth year], 79 y.o.   MRN: [id number]  Incoming call received from patient. HIPAA verified.  Patient previously has been helped with medication assistance. Patient informs he called [name] a few weeks ago and they were going to mail him an application. As of today, patient has not received that application.  Informed patient a blank application would be sent to him for him to complete. Informed patient to take the entire application to his provider's office (non [facility] provider, to have them complete their part and to have them fax to [place] on his behalf. Patient was amenable to this plan.  Rondle Lohse, CPhT [facility] Office: [phone] Email: [email or web address]  "

## 2025-01-17 ENCOUNTER — Encounter (INDEPENDENT_AMBULATORY_CARE_PROVIDER_SITE_OTHER): Admitting: Ophthalmology
